# Patient Record
Sex: Female | Born: 1965 | Race: Asian | Hispanic: No | Marital: Married | Smoking: Never smoker
Health system: Southern US, Community
[De-identification: ages and names within clinical notes are randomized; demographics above are authoritative.]

## PROBLEM LIST (undated history)

## (undated) DIAGNOSIS — I10 Essential (primary) hypertension: Secondary | ICD-10-CM

## (undated) DIAGNOSIS — F419 Anxiety disorder, unspecified: Secondary | ICD-10-CM

## (undated) HISTORY — DX: Anxiety disorder, unspecified: F41.9

## (undated) HISTORY — PX: OTHER SURGICAL HISTORY: SHX169

---

## 2013-12-17 ENCOUNTER — Ambulatory Visit (INDEPENDENT_AMBULATORY_CARE_PROVIDER_SITE_OTHER): Payer: BC Managed Care – PPO | Admitting: Emergency Medicine

## 2013-12-17 VITALS — BP 120/80 | HR 71 | Temp 97.7°F | Ht 61.0 in | Wt 139.0 lb

## 2013-12-17 DIAGNOSIS — R42 Dizziness and giddiness: Secondary | ICD-10-CM

## 2013-12-17 LAB — POCT CBC
GRANULOCYTE PERCENT: 51 % (ref 37–80)
HEMATOCRIT: 36.7 % — AB (ref 37.7–47.9)
Hemoglobin: 11.6 g/dL — AB (ref 12.2–16.2)
Lymph, poc: 3 (ref 0.6–3.4)
MCH, POC: 28.7 pg (ref 27–31.2)
MCHC: 31.6 g/dL — AB (ref 31.8–35.4)
MCV: 90.8 fL (ref 80–97)
MID (cbc): 0.5 (ref 0–0.9)
MPV: 7.5 fL (ref 0–99.8)
POC Granulocyte: 3.6 (ref 2–6.9)
POC LYMPH %: 42.6 % (ref 10–50)
POC MID %: 6.4 %M (ref 0–12)
Platelet Count, POC: 451 10*3/uL — AB (ref 142–424)
RBC: 4.04 M/uL (ref 4.04–5.48)
RDW, POC: 13.1 %
WBC: 7.1 10*3/uL (ref 4.6–10.2)

## 2013-12-17 MED ORDER — MECLIZINE HCL 25 MG PO TABS: 25.0000 mg | ORAL_TABLET | Freq: Three times a day (TID) | ORAL | Status: DC | PRN

## 2013-12-17 NOTE — Patient Instructions (Signed)
Vertigo Vertigo means you feel like you or your surroundings are moving when they are not. Vertigo can be dangerous if it occurs when you are at work, driving, or performing difficult activities.  CAUSES  Vertigo occurs when there is a conflict of signals sent to your brain from the visual and sensory systems in your body. There are many different causes of vertigo, including:  Infections, especially in the inner ear.  A bad reaction to a drug or misuse of alcohol and medicines.  Withdrawal from drugs or alcohol.  Rapidly changing positions, such as lying down or rolling over in bed.  A migraine headache.  Decreased blood flow to the brain.  Increased pressure in the brain from a head injury, infection, tumor, or bleeding. SYMPTOMS  You may feel as though the world is spinning around or you are falling to the ground. Because your balance is upset, vertigo can cause nausea and vomiting. You may have involuntary eye movements (nystagmus). DIAGNOSIS  Vertigo is usually diagnosed by physical exam. If the cause of your vertigo is unknown, your caregiver may perform imaging tests, such as an MRI scan (magnetic resonance imaging). TREATMENT  Most cases of vertigo resolve on their own, without treatment. Depending on the cause, your caregiver may prescribe certain medicines. If your vertigo is related to body position issues, your caregiver may recommend movements or procedures to correct the problem. In rare cases, if your vertigo is caused by certain inner ear problems, you may need surgery. HOME CARE INSTRUCTIONS   Follow your caregiver's instructions.  Avoid driving.  Avoid operating heavy machinery.  Avoid performing any tasks that would be dangerous to you or others during a vertigo episode.  Tell your caregiver if you notice that certain medicines seem to be causing your vertigo. Some of the medicines used to treat vertigo episodes can actually make them worse in some people. SEEK  IMMEDIATE MEDICAL CARE IF:   Your medicines do not relieve your vertigo or are making it worse.  You develop problems with talking, walking, weakness, or using your arms, hands, or legs.  You develop severe headaches.  Your nausea or vomiting continues or gets worse.  You develop visual changes.  A family member notices behavioral changes.  Your condition gets worse. MAKE SURE YOU:  Understand these instructions.  Will watch your condition.  Will get help right away if you are not doing well or get worse. Document Released: 04/05/2005 Document Revised: 09/18/2011 Document Reviewed: 01/12/2011 ExitCare Patient Information 2014 [employer].  

## 2013-12-17 NOTE — Progress Notes (Signed)
[facility] [address], [place] [address] [phone] [phone]  Date:  12/17/2013   Name:  Carroll Isabella   DOB:  [birth year]   MRN:  [id number]  PCP:  No PCP Per Patient    Chief Complaint: Dizziness and Rash   History of Present Illness:  Alishia Schroyer is a 48 y.o. very pleasant female patient who presents with the following:  In the [place] for 6 months following immigration from [place].  Over the past month she has experienced short duration episodes of dizziness and a sensation she would pass out.  She has no sensation of a rapid or irregular heart beat, chest pain, tightness or heaviness nor shortness of breath.  No visual symptoms but does have difficulty with gait and balance when afflicted.  She says the episodes come with no warning and are not postural.  She says they last for minutes and may recurr during the day but are not daily.  She has no hypertension, diabetes, HLD.  Non smoker.  No medications.  No history of head injury or antecedent illness.  She has never had these episodes prior to the past month.  She has no nausea or vomiting, no stool change, fever or chills.  No improvement with over the counter medications or other home remedies. Denies other complaint or health concern today.   There are no active problems to display for this patient.   No past medical history on file.  No past surgical history on file.  History  Substance Use Topics  . Smoking status: Never Smoker   . Smokeless tobacco: Never Used  . Alcohol Use: No    No family history on file.  No Known Allergies  Medication list has been reviewed and updated.  No current outpatient prescriptions on file prior to visit.   No current facility-administered medications on file prior to visit.    Review of Systems:  As per HPI, otherwise negative.    Physical Examination: Filed Vitals:   12/17/13 2036  BP: 120/80  Pulse: 71  Temp: 97.7 F (36.5 C)    Filed Vitals:   12/17/13 2036  Height: 5\' 1"  (1.549 m)  Weight: 139 lb (63.05 kg)   Body mass index is 26.28 kg/(m^2). Ideal Body Weight: Weight in (lb) to have BMI = 25: 132  GEN: WDWN, NAD, Non-toxic, A & O x 3 HEENT: Atraumatic, Normocephalic. Neck supple. No masses, No LAD. Ears and Nose: No external deformity. CV: RRR, No M/G/R. No JVD. No thrill. No extra heart sounds.  No carotid bruit PULM: CTA B, no wheezes, crackles, rhonchi. No retractions. No resp. distress. No accessory muscle use. ABD: S, NT, ND, +BS. No rebound. No HSM. EXTR: No c/c/e NEURO Normal gait, romberg, and tandem gait.  PRRERLA EOMI, CN 2-12 intact PSYCH: Normally interactive. Conversant. Not depressed or anxious appearing.  Calm demeanor.    Assessment and Plan: Dizziness Consider TIA ASA daily Refer neuro Try antivert Red flags discussed as well as ER for worsened symptoms  Signed,  Phillips Odor, MD

## 2013-12-18 LAB — COMPREHENSIVE METABOLIC PANEL
ALK PHOS: 49 U/L (ref 39–117)
ALT: 18 U/L (ref 0–35)
AST: 14 U/L (ref 0–37)
Albumin: 4.4 g/dL (ref 3.5–5.2)
BUN: 9 mg/dL (ref 6–23)
CALCIUM: 9.4 mg/dL (ref 8.4–10.5)
CO2: 26 mEq/L (ref 19–32)
Chloride: 104 mEq/L (ref 96–112)
Creat: 0.76 mg/dL (ref 0.50–1.10)
GLUCOSE: 88 mg/dL (ref 70–99)
Potassium: 4.4 mEq/L (ref 3.5–5.3)
Sodium: 139 mEq/L (ref 135–145)
Total Bilirubin: 0.3 mg/dL (ref 0.2–1.2)
Total Protein: 7.6 g/dL (ref 6.0–8.3)

## 2013-12-18 LAB — TSH: TSH: 1.636 u[IU]/mL (ref 0.350–4.500)

## 2013-12-25 ENCOUNTER — Ambulatory Visit (INDEPENDENT_AMBULATORY_CARE_PROVIDER_SITE_OTHER): Payer: BC Managed Care – PPO | Admitting: Family Medicine

## 2013-12-25 VITALS — BP 124/78 | HR 73 | Temp 97.9°F | Resp 18 | Ht 62.0 in | Wt 124.0 lb

## 2013-12-25 DIAGNOSIS — R42 Dizziness and giddiness: Secondary | ICD-10-CM

## 2013-12-25 DIAGNOSIS — J392 Other diseases of pharynx: Secondary | ICD-10-CM

## 2013-12-25 MED ORDER — FLUTICASONE PROPIONATE 50 MCG/ACT NA SUSP: 2.0000 | Freq: Every day | NASAL | Status: DC

## 2013-12-25 NOTE — Progress Notes (Signed)
   Subjective:    Patient ID: Karina Baker, female    DOB: Jan 11, 1966, 48 y.o.   MRN: [id number]  HPI This is a very pleasant 48 yo female who presents today complaining of dry throat and having a funny feeling in left ear for 2 days. She was seen 12/17/2013 by Dr. Dareen PianoAnderson for complaints of dizziness, near syncope and feeling foggy in her brain. She was started on meclizine and low dose aspirin and referred to neurology for evaluation of symptoms and questionable TIA. [facility]. Her symptoms for which she saw Dr. Dareen PianoAnderson last week have not increased in frequency or intensity and may have been improved with meclizine. She admits that her stress level may be contributing to her symptoms. Her main complaints today are a funny feeling in her right ear and dry throat. She notes some improvement to hear ear with pressure relief measures, she hears crinkling with vigorous chewing.  The patient [place] moved [place] She has been married less than 1 year. 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 She was evaluated by her eye doctor yesterday who told her that her reading glasses prescription was too strong and she has since gotten the appropriate strength.   Review of Systems No fever, no chills, no headache, mild sore throat, no nasal drainage, some post nasal drainage, no cough.    Objective:   Physical Exam  Vitals reviewed. Constitutional: She is oriented to person, place, and time. She appears well-developed and well-nourished. No distress.  HENT:  Head: Normocephalic and atraumatic.  Right Ear: Hearing, external ear and ear canal normal. Tympanic membrane is retracted.  Left Ear: Hearing, tympanic membrane, external ear and ear canal normal.  Nose: Nose normal.  Mouth/Throat: Oropharynx is clear and moist. No oropharyngeal exudate.  Eyes: Conjunctivae and  EOM are normal. Pupils are equal, round, and reactive to light. Right eye exhibits no discharge. Left eye exhibits no discharge. No scleral icterus.  Neck: Normal range of motion. Neck supple.  Cardiovascular: Normal rate, regular rhythm and normal heart sounds.   Pulmonary/Chest: Effort normal and breath sounds normal.  Musculoskeletal: Normal range of motion.  Lymphadenopathy:    She has no cervical adenopathy.  Neurological: She is alert and oriented to person, place, and time. She has normal reflexes. No cranial nerve deficit.  Skin: Skin is warm and dry. She is not diaphoretic.  Psychiatric: She has a normal mood and affect. Her behavior is normal. Judgment and thought content normal.       Assessment & Plan:  1. Eustacian tube disorder -she can continue meclizine - fluticasone (FLONASE) 50 MCG/ACT nasal spray; Place 2 sprays into both nostrils daily.  Dispense: 16 g; Refill: 6 - can continue to do gentle pressure relief measure  2. Dry throat -explained that this is a common side effect from meclizine -encouraged increased hydration, sour candies  Appointment with neurology as scheduled tomorrow  Emi Belfasteborah B. Gessner, FNP-BC  [facility]  12/27/2013 8:21 PM

## 2013-12-26 ENCOUNTER — Ambulatory Visit (INDEPENDENT_AMBULATORY_CARE_PROVIDER_SITE_OTHER): Payer: BC Managed Care – PPO | Admitting: Neurology

## 2013-12-26 ENCOUNTER — Encounter: Payer: Self-pay | Admitting: Neurology

## 2013-12-26 VITALS — BP 133/71 | HR 93 | Ht 60.0 in | Wt 140.0 lb

## 2013-12-26 DIAGNOSIS — F419 Anxiety disorder, unspecified: Secondary | ICD-10-CM | POA: Insufficient documentation

## 2013-12-26 DIAGNOSIS — R42 Dizziness and giddiness: Secondary | ICD-10-CM

## 2013-12-26 DIAGNOSIS — F411 Generalized anxiety disorder: Secondary | ICD-10-CM

## 2013-12-26 NOTE — Progress Notes (Signed)
[facility]    Karina Baker:  Dr Hosie PoissonSumner Referring Karina Baker: Karina Baker, [name], MD Primary Care Physician:  No PCP Per Patient  CC:  dizziness  HPI:  Karina GitelmanChristine Belle Baker is a 48 y.o. female here as a referral from Dr. Dareen PianoAnderson for dizziness evaluation  Recent immigration from [place]. Recent onset of brief episodes of dizziness and sensation of passing out. No palpitations or chest pain associated with this events. No visual changes. Does note difficulty with gait and imbalance during these events. Come on suddenly, last a few minutes. Notes initial episode occurred after missing a meal. Not postural. Notes that it can be triggered by stress or anxiety. Recently evaluated by eye doctor, told her prescription was too strong. She spends a lot of time in front of the computer and is stressed at work, needs to reach a quota every day. No focal motor or sensory changes. Notes she does hydrate well, will occasionally skip a meal while at work, thinks events may happen more after she skips a meal. Evaluated in the ED, started on daily ASA 81mg .   Notes that the majority of the events occur while at work, very infrequently away from work. No history of HTN, DM, HLD. No family history of multiple strokes/dementia at a young age.   Review of Systems: Out of a complete 14 system review, the patient complains of only the following symptoms, and all other reviewed systems are negative. + blurred vision, numbness, dizziness, snoring, anxiety  History   Social History  . Marital Status: Married    Spouse Name: N/A    Number of Children: N/A  . Years of Education: N/A   Occupational History  . Not on file.   Social History Main Topics  . Smoking status: Never Smoker   . Smokeless tobacco: Never Used  . Alcohol Use: No  . Drug Use: No  . Sexual Activity: Not on file   Other Topics Concern  . Not on file   Social History Narrative   Patient is married   Patient is  right handed   Patient has a Energy managerBachelor's degree   Patient drinks 1 cup daily    No family history on file.  No past medical history on file.  No past surgical history on file.  Current Outpatient Prescriptions  Medication Sig Dispense Refill  . fluticasone (FLONASE) 50 MCG/ACT nasal spray Place 2 sprays into both nostrils daily.  16 g  6  . meclizine (ANTIVERT) 25 MG tablet Take 1 tablet (25 mg total) by mouth 3 (three) times daily as needed.  60 tablet  0   No current facility-administered medications for this visit.    Allergies as of 12/26/2013  . (No Known Allergies)    Vitals: BP 133/71  Pulse 93  Ht 5' (1.524 m)  Wt 140 lb (63.504 kg)  BMI 27.34 kg/m2 Last Weight:  Wt Readings from Last 1 Encounters:  12/26/13 140 lb (63.504 kg)   Last Height:   Ht Readings from Last 1 Encounters:  12/26/13 5' (1.524 m)     Physical exam: Exam: Gen: NAD, conversant Eyes: anicteric sclerae, moist conjunctivae HENT: Atraumatic, oropharynx clear Neck: Trachea midline; supple,  Lungs: CTA, no wheezing, rales, rhonic                          CV: RRR, no MRG Abdomen: Soft, non-tender;  Extremities: No peripheral edema  Skin: Normal temperature, no rash,  Psych:  Appropriate affect, pleasant  Neuro: MS: AA&Ox3, appropriately interactive, normal affect   Speech: fluent w/o paraphasic error  Memory: good recent and remote recall  CN: PERRL, EOMI no nystagmus, no ptosis, sensation intact to LT V1-V3 bilat, face symmetric, no weakness, hearing grossly intact, palate elevates symmetrically, shoulder shrug 5/5 bilat,  tongue protrudes midline, no fasiculations noted.  Motor: normal bulk and tone Strength: 5/5  In all extremities  Coord: rapid alternating and point-to-point (FNF, HTS) movements intact.  Reflexes: symmetrical, bilat downgoing toes  Sens: LT intact in all extremities  Gait: posture, stance, stride and arm-swing normal. Tandem gait intact. Able to walk on  heels and toes. Romberg absent.   Assessment:  After physical and neurologic examination, review of laboratory studies, imaging, neurophysiology testing and pre-existing records, assessment will be reviewed on the problem list.  Plan:  Treatment plan and additional workup will be reviewed under Problem List.  1)Dizziness 2)Anxiety  47y/o woman presents for initial evaluation of recurrent episodes of transient dizziness. Based on clinical history and exam suspect that these likely are anxiety induced. Would also consider TIA though less likely based on history and lack of risk factors. Therefore will hold off on TIA workup at this time. Will continue ASA but can d/c meclizine. Counseled patient on stress reducing activities. Counseled her to wear her new glasses with the correct prescription. Instructed her to call our office in 4 weeks if no improvement in symptoms, instructed to ED if symptoms worsen or do not resolve.  Karina ChoPeter Sumner, DO  [facility] [address] [place], [place] [id number]  Phone [phone] Fax [phone]

## 2013-12-26 NOTE — Patient Instructions (Signed)
Overall you are doing fairly well but I do want to suggest a few things today:   Remember to drink plenty of fluid, eat healthy meals and do not skip any meals. Try to eat protein with a every meal and eat a healthy snack such as fruit or nuts in between meals. Try to keep a regular sleep-wake schedule and try to exercise daily, particularly in the form of walking, 20-30 minutes a day, if you can.   As far as your medications are concerned, I would like to suggest you stop the meclizine  As far as diagnostic testing:  1)If you symptoms do not improve in the next few weeks we will plan to get a MRI of the brain  I suspect that your symptoms may be related to stress and anxiety. Please try to practice deep breathing and relaxation exercises.   Please call me in 4 weeks to update me on your status  Please call us with any interim questions, concerns, problems, updates or refill requests.   My clinical assistant and will answer any of your questions and relay your messages to me and also relay most of my messages to you.   Our phone number is [phone]. We also have an after hours call service for urgent matters and there is a physician on-call for urgent questions. For any emergencies you know to call 911 or go to the nearest emergency room

## 2015-08-02 ENCOUNTER — Ambulatory Visit (INDEPENDENT_AMBULATORY_CARE_PROVIDER_SITE_OTHER): Payer: BLUE CROSS/BLUE SHIELD | Admitting: Physician Assistant

## 2015-08-02 ENCOUNTER — Ambulatory Visit (HOSPITAL_COMMUNITY): Payer: Self-pay

## 2015-08-02 ENCOUNTER — Ambulatory Visit (INDEPENDENT_AMBULATORY_CARE_PROVIDER_SITE_OTHER): Payer: BLUE CROSS/BLUE SHIELD

## 2015-08-02 VITALS — BP 138/88 | HR 76 | Temp 98.5°F | Resp 16 | Ht 61.5 in | Wt 144.4 lb

## 2015-08-02 DIAGNOSIS — R079 Chest pain, unspecified: Secondary | ICD-10-CM | POA: Diagnosis not present

## 2015-08-02 DIAGNOSIS — Z3202 Encounter for pregnancy test, result negative: Secondary | ICD-10-CM

## 2015-08-02 LAB — COMPLETE METABOLIC PANEL WITH GFR
ALT: 18 U/L (ref 6–29)
AST: 15 U/L (ref 10–35)
Albumin: 4.7 g/dL (ref 3.6–5.1)
Alkaline Phosphatase: 50 U/L (ref 33–115)
BUN: 11 mg/dL (ref 7–25)
CALCIUM: 9.9 mg/dL (ref 8.6–10.2)
CHLORIDE: 103 mmol/L (ref 98–110)
CO2: 24 mmol/L (ref 20–31)
Creat: 0.76 mg/dL (ref 0.50–1.10)
GFR, Est African American: >89 mL/min (ref >=60)
GFR, Est Non African American: >89 mL/min (ref >=60)
Glucose, Bld: 93 mg/dL (ref 65–99)
POTASSIUM: 3.8 mmol/L (ref 3.5–5.3)
SODIUM: 141 mmol/L (ref 135–146)
Total Bilirubin: 0.5 mg/dL (ref 0.2–1.2)
Total Protein: 7.7 g/dL (ref 6.1–8.1)

## 2015-08-02 LAB — POCT CBC
GRANULOCYTE PERCENT: 54.8 % (ref 37–80)
HEMATOCRIT: 43.7 % (ref 37.7–47.9)
HEMOGLOBIN: 15 g/dL (ref 12.2–16.2)
LYMPH, POC: 3 (ref 0.6–3.4)
MCH: 29.6 pg (ref 27–31.2)
MCHC: 34.4 g/dL (ref 31.8–35.4)
MCV: 86 fL (ref 80–97)
MID (cbc): 0.4 (ref 0–0.9)
MPV: 6.5 fL (ref 0–99.8)
POC Granulocyte: 4.2 (ref 2–6.9)
POC LYMPH PERCENT: 39.6 %L (ref 10–50)
POC MID %: 5.6 % (ref 0–12)
Platelet Count, POC: 468 10*3/uL — AB (ref 142–424)
RBC: 5.08 M/uL (ref 4.04–5.48)
RDW, POC: 13.2 %
WBC: 7.6 10*3/uL (ref 4.6–10.2)

## 2015-08-02 LAB — GLUCOSE, POCT (MANUAL RESULT ENTRY): POC Glucose: 77 mg/dl (ref 70–99)

## 2015-08-02 LAB — POCT URINALYSIS DIP (MANUAL ENTRY)
Bilirubin, UA: NEGATIVE
GLUCOSE UA: NEGATIVE
Ketones, POC UA: NEGATIVE
LEUKOCYTES UA: NEGATIVE
NITRITE UA: NEGATIVE
Protein Ur, POC: NEGATIVE
Spec Grav, UA: 1.01
UROBILINOGEN UA: 0.2
pH, UA: 7

## 2015-08-02 LAB — TSH: TSH: 1.352 u[IU]/mL (ref 0.350–4.500)

## 2015-08-02 LAB — TROPONIN I

## 2015-08-02 LAB — D-DIMER, QUANTITATIVE (NOT AT ARMC): D DIMER QUANT: 0.22 ug{FEU}/mL (ref 0.00–0.48)

## 2015-08-02 LAB — POCT URINE PREGNANCY: PREG TEST UR: NEGATIVE

## 2015-08-02 NOTE — Addendum Note (Signed)
Addended by: Clydia Llano on: 08/02/2015 05:26 PM   Modules accepted: Orders

## 2015-08-02 NOTE — Progress Notes (Signed)
08/02/2015 1:47 PM   DOB: 12/04/1965 / MRN: [id number]  SUBJECTIVE:  Karina Baker is a 50 y.o. never smoking female with a history of anxiety presenting for mild "dullness"  in the left side of her chest that started last week. Reports the symptoms may last an hour. She noticed this for the first time while doing household chores and sometimes with sleeping on her left side.  She associates dizziness, nausea, however these symptoms don't always occur together.  Denies orthopnea, posterior leg pain, leg swelling, belly swelling, SOB and DOE.   She complains of mild left shoulder pain and popping with abduction and flexion above 90 degrees.  This is made worse with using the arm and better with rest.       She has No Known Allergies.   She  has a past medical history of Anxiety.    She  reports that she has never smoked. She has never used smokeless tobacco. She reports that she does not drink alcohol or use illicit drugs. She  has no sexual activity history on file. The patient  has no past surgical history on file.  Her family history is not on file.  Review of Systems  Constitutional: Negative for fever.  Respiratory: Negative for cough.   Cardiovascular: Positive for chest pain.  Gastrointestinal: Negative for nausea.  Skin: Negative for rash.  Neurological: Negative for dizziness and headaches.  Psychiatric/Behavioral: Negative for depression.    Problem list and medications reviewed and updated by myself where necessary, and exist elsewhere in the encounter.   OBJECTIVE:  BP 138/88 mmHg  Pulse 76  Temp(Src) 98.5 F (36.9 C) (Oral)  Resp 16  Ht 5' 1.5" (1.562 m)  Wt 144 lb 6.4 oz (65.499 kg)  BMI 26.85 kg/m2  SpO2 98%  Physical Exam  Constitutional: She is oriented to person, place, and time. She appears well-nourished. No distress.  Eyes: EOM are normal. Pupils are equal, round, and reactive to light.  Cardiovascular: Normal rate.   Pulmonary/Chest:  Effort normal.  Abdominal: She exhibits no distension.  Neurological: She is alert and oriented to person, place, and time. No cranial nerve deficit. Gait normal.  Skin: Skin is dry. She is not diaphoretic.  Psychiatric: She has a normal mood and affect.  Vitals reviewed.   Results for orders placed or performed in visit on 08/02/15 (from the past 72 hour(s))  POCT CBC     Status: Abnormal   Collection Time: 08/02/15  1:13 PM  Result Value Ref Range   WBC 7.6 4.6 - 10.2 K/uL   Lymph, poc 3.0 0.6 - 3.4   POC LYMPH PERCENT 39.6 10 - 50 %L   MID (cbc) 0.4 0 - 0.9   POC MID % 5.6 0 - 12 %M   POC Granulocyte 4.2 2 - 6.9   Granulocyte percent 54.8 37 - 80 %G   RBC 5.08 4.04 - 5.48 M/uL   Hemoglobin 15.0 12.2 - 16.2 g/dL   HCT, POC 78.2 95.6 - 47.9 %   MCV 86.0 80 - 97 fL   MCH, POC 29.6 27 - 31.2 pg   MCHC 34.4 31.8 - 35.4 g/dL   RDW, POC 21.3 %   Platelet Count, POC 468 (A) 142 - 424 K/uL   MPV 6.5 0 - 99.8 fL  POCT glucose (manual entry)     Status: None   Collection Time: 08/02/15  1:13 PM  Result Value Ref Range   POC Glucose 77 70 -  99 mg/dl  POCT urinalysis dipstick     Status: Abnormal   Collection Time: 08/02/15  1:18 PM  Result Value Ref Range   Color, UA yellow yellow   Clarity, UA clear clear   Glucose, UA negative negative   Bilirubin, UA negative negative   Ketones, POC UA negative negative   Spec Grav, UA 1.010    Blood, UA trace-lysed (A) negative   pH, UA 7.0    Protein Ur, POC negative negative   Urobilinogen, UA 0.2    Nitrite, UA Negative Negative   Leukocytes, UA Negative Negative  POCT urine pregnancy     Status: None   Collection Time: 08/02/15  1:18 PM  Result Value Ref Range   Preg Test, Ur Negative Negative    ASSESSMENT AND PLAN  Lenia was seen today for chest pain, arm pain, headache and dizziness.  Diagnoses and all orders for this visit:  Chest pain, unspecified chest pain type: Her exam is reassuring.  There are no changes in her  EKG from 12/17/13. Rad read reassuring.  Will run D-dimer and Troponin to exclude a life threatening process.  Will call her back at [phone] and have advised that if either the trop or d-dimer is elevated she go to the ED.  -     EKG 12-Lead -     POCT CBC -     POCT glucose (manual entry) -     DG Chest 2 View; Future -     POCT urinalysis dipstick -     POCT urine pregnancy -     DG Chest 2 View; Future -     D-dimer, quantitative (not at [facility]) -     Troponin I -     COMPLETE METABOLIC PANEL WITH GFR -     TSH    The patient was advised to call or return to clinic if she does not see an improvement in symptoms or to seek the care of the closest emergency department if she worsens with the above plan.   Deliah Boston, MHS, PA-C [facility] 08/02/2015 1:47 PM

## 2015-08-15 ENCOUNTER — Encounter: Payer: Self-pay | Admitting: *Deleted

## 2016-01-27 ENCOUNTER — Other Ambulatory Visit (HOSPITAL_COMMUNITY)
Admission: RE | Admit: 2016-01-27 | Discharge: 2016-01-27 | Disposition: A | Discharge status: Home / Self Care | Payer: BLUE CROSS/BLUE SHIELD | Source: Ambulatory Visit | Attending: Family Medicine | Admitting: Family Medicine

## 2016-01-27 ENCOUNTER — Other Ambulatory Visit: Payer: Self-pay | Admitting: Family Medicine

## 2016-01-27 DIAGNOSIS — Z1151 Encounter for screening for human papillomavirus (HPV): Secondary | ICD-10-CM | POA: Insufficient documentation

## 2016-01-27 DIAGNOSIS — Z01411 Encounter for gynecological examination (general) (routine) with abnormal findings: Secondary | ICD-10-CM | POA: Diagnosis present

## 2016-01-31 LAB — CYTOLOGY - PAP

## 2017-05-14 ENCOUNTER — Other Ambulatory Visit: Payer: Self-pay | Admitting: Family Medicine

## 2017-05-14 DIAGNOSIS — Z1231 Encounter for screening mammogram for malignant neoplasm of breast: Secondary | ICD-10-CM

## 2017-05-21 ENCOUNTER — Ambulatory Visit
Admission: RE | Admit: 2017-05-21 | Discharge: 2017-05-21 | Disposition: A | Discharge status: Home / Self Care | Payer: BLUE CROSS/BLUE SHIELD | Source: Ambulatory Visit | Attending: Family Medicine | Admitting: Family Medicine

## 2017-05-21 DIAGNOSIS — Z1231 Encounter for screening mammogram for malignant neoplasm of breast: Secondary | ICD-10-CM

## 2018-07-18 ENCOUNTER — Other Ambulatory Visit: Payer: Self-pay | Admitting: Family Medicine

## 2018-07-18 DIAGNOSIS — Z1231 Encounter for screening mammogram for malignant neoplasm of breast: Secondary | ICD-10-CM

## 2018-10-02 ENCOUNTER — Other Ambulatory Visit: Payer: Self-pay | Admitting: Family Medicine

## 2018-10-02 ENCOUNTER — Ambulatory Visit: Payer: BLUE CROSS/BLUE SHIELD

## 2018-10-02 ENCOUNTER — Other Ambulatory Visit (HOSPITAL_COMMUNITY)
Admission: RE | Admit: 2018-10-02 | Discharge: 2018-10-02 | Disposition: A | Discharge status: Home / Self Care | Payer: BLUE CROSS/BLUE SHIELD | Source: Ambulatory Visit | Attending: Family Medicine | Admitting: Family Medicine

## 2018-10-02 DIAGNOSIS — Z01411 Encounter for gynecological examination (general) (routine) with abnormal findings: Secondary | ICD-10-CM | POA: Insufficient documentation

## 2018-10-02 DIAGNOSIS — I10 Essential (primary) hypertension: Secondary | ICD-10-CM | POA: Diagnosis not present

## 2018-10-02 DIAGNOSIS — E559 Vitamin D deficiency, unspecified: Secondary | ICD-10-CM | POA: Diagnosis not present

## 2018-10-02 DIAGNOSIS — Z0000 Encounter for general adult medical examination without abnormal findings: Secondary | ICD-10-CM | POA: Diagnosis not present

## 2018-10-02 DIAGNOSIS — Z1211 Encounter for screening for malignant neoplasm of colon: Secondary | ICD-10-CM | POA: Diagnosis not present

## 2018-10-04 LAB — CYTOLOGY - PAP: Diagnosis: NEGATIVE

## 2019-01-03 ENCOUNTER — Ambulatory Visit: Payer: BLUE CROSS/BLUE SHIELD

## 2019-03-10 ENCOUNTER — Ambulatory Visit
Admission: RE | Admit: 2019-03-10 | Discharge: 2019-03-10 | Disposition: A | Discharge status: Home / Self Care | Payer: 59 | Source: Ambulatory Visit | Attending: Family Medicine | Admitting: Family Medicine

## 2019-03-10 ENCOUNTER — Other Ambulatory Visit: Payer: Self-pay | Admitting: Family Medicine

## 2019-03-10 ENCOUNTER — Other Ambulatory Visit: Payer: Self-pay

## 2019-03-10 DIAGNOSIS — Z1231 Encounter for screening mammogram for malignant neoplasm of breast: Secondary | ICD-10-CM

## 2020-08-06 ENCOUNTER — Other Ambulatory Visit: Payer: Self-pay | Admitting: Family Medicine

## 2020-08-06 DIAGNOSIS — Z0000 Encounter for general adult medical examination without abnormal findings: Secondary | ICD-10-CM

## 2020-11-15 ENCOUNTER — Ambulatory Visit
Admission: RE | Admit: 2020-11-15 | Discharge: 2020-11-15 | Disposition: A | Discharge status: Home / Self Care | Payer: BC Managed Care – PPO | Source: Ambulatory Visit | Attending: Family Medicine | Admitting: Family Medicine

## 2020-11-15 ENCOUNTER — Other Ambulatory Visit: Payer: Self-pay

## 2020-11-15 DIAGNOSIS — Z0000 Encounter for general adult medical examination without abnormal findings: Secondary | ICD-10-CM

## 2021-07-06 ENCOUNTER — Other Ambulatory Visit: Payer: Self-pay | Admitting: Family Medicine

## 2021-07-06 DIAGNOSIS — Z1231 Encounter for screening mammogram for malignant neoplasm of breast: Secondary | ICD-10-CM

## 2021-10-28 ENCOUNTER — Emergency Department (HOSPITAL_BASED_OUTPATIENT_CLINIC_OR_DEPARTMENT_OTHER): Payer: BC Managed Care – PPO

## 2021-10-28 ENCOUNTER — Encounter (HOSPITAL_BASED_OUTPATIENT_CLINIC_OR_DEPARTMENT_OTHER): Payer: Self-pay | Admitting: Emergency Medicine

## 2021-10-28 ENCOUNTER — Other Ambulatory Visit: Payer: Self-pay

## 2021-10-28 ENCOUNTER — Emergency Department (HOSPITAL_BASED_OUTPATIENT_CLINIC_OR_DEPARTMENT_OTHER)
Admission: EM | Admit: 2021-10-28 | Discharge: 2021-10-28 | Disposition: A | Discharge status: Home / Self Care | Payer: BC Managed Care – PPO | Attending: Emergency Medicine | Admitting: Emergency Medicine

## 2021-10-28 DIAGNOSIS — I10 Essential (primary) hypertension: Secondary | ICD-10-CM | POA: Diagnosis not present

## 2021-10-28 DIAGNOSIS — R2689 Other abnormalities of gait and mobility: Secondary | ICD-10-CM | POA: Insufficient documentation

## 2021-10-28 DIAGNOSIS — R002 Palpitations: Secondary | ICD-10-CM | POA: Insufficient documentation

## 2021-10-28 HISTORY — DX: Essential (primary) hypertension: I10

## 2021-10-28 LAB — CBC WITH DIFFERENTIAL/PLATELET
Abs Immature Granulocytes: 0.02 10*3/uL (ref 0.00–0.07)
Basophils Absolute: 0 10*3/uL (ref 0.0–0.1)
Basophils Relative: 0 %
Eosinophils Absolute: 0.3 10*3/uL (ref 0.0–0.5)
Eosinophils Relative: 4 %
HCT: 42.5 % (ref 36.0–46.0)
Hemoglobin: 14.1 g/dL (ref 12.0–15.0)
Immature Granulocytes: 0 %
Lymphocytes Relative: 38 %
Lymphs Abs: 2.7 10*3/uL (ref 0.7–4.0)
MCH: 29.4 pg (ref 26.0–34.0)
MCHC: 33.2 g/dL (ref 30.0–36.0)
MCV: 88.5 fL (ref 80.0–100.0)
Monocytes Absolute: 0.3 10*3/uL (ref 0.1–1.0)
Monocytes Relative: 4 %
Neutro Abs: 3.9 10*3/uL (ref 1.7–7.7)
Neutrophils Relative %: 54 %
Platelets: 482 10*3/uL — ABNORMAL HIGH (ref 150–400)
RBC: 4.8 MIL/uL (ref 3.87–5.11)
RDW: 12.6 % (ref 11.5–15.5)
WBC: 7.3 10*3/uL (ref 4.0–10.5)
nRBC: 0 % (ref 0.0–0.2)

## 2021-10-28 LAB — COMPREHENSIVE METABOLIC PANEL
ALT: 22 U/L (ref 0–44)
AST: 19 U/L (ref 15–41)
Albumin: 4.3 g/dL (ref 3.5–5.0)
Alkaline Phosphatase: 59 U/L (ref 38–126)
Anion gap: 9 (ref 5–15)
BUN: 17 mg/dL (ref 6–20)
CO2: 26 mmol/L (ref 22–32)
Calcium: 9.2 mg/dL (ref 8.9–10.3)
Chloride: 104 mmol/L (ref 98–111)
Creatinine, Ser: 0.83 mg/dL (ref 0.44–1.00)
GFR, Estimated: >60 mL/min (ref >60)
Glucose, Bld: 119 mg/dL — ABNORMAL HIGH (ref 70–99)
Potassium: 3.6 mmol/L (ref 3.5–5.1)
Sodium: 139 mmol/L (ref 135–145)
Total Bilirubin: 0.3 mg/dL (ref 0.3–1.2)
Total Protein: 7.8 g/dL (ref 6.5–8.1)

## 2021-10-28 LAB — TROPONIN I (HIGH SENSITIVITY): Troponin I (High Sensitivity): <2 ng/L (ref <18)

## 2021-10-28 LAB — MAGNESIUM: Magnesium: 2.1 mg/dL (ref 1.7–2.4)

## 2021-10-28 MED ORDER — MECLIZINE HCL 25 MG PO TABS: 25.0000 mg | ORAL_TABLET | Freq: Three times a day (TID) | ORAL | 0 refills | Status: DC | PRN

## 2021-10-28 NOTE — ED Provider Notes (Signed)
? ?Emergency Department Provider Note ? ? ?I have reviewed the triage vital signs and the nursing notes. ? ? ?HISTORY ? ?Chief Complaint ?Palpitations ? ? ?HPI ?Karina Baker is a 56 y.o. female with PMH of anxiety and HTN presents to the emergency department for evaluation of intermittently feeling off balance along with heart palpitations.  She notes several weeks of episodic palpitations.  She feels like her heart is "skipping a beat" but sometimes symptoms are more persistent.  Denies any chest pain or pressure.  Denies shortness of breath.  She also feels off balance at times including last night and this morning.  No clear trigger.  She is not experiencing vertigo during these episodes.  She occasionally experiences near syncope events with bowel movements but states this has been going on for a Syleena Mchan time and not particularly worse.  No syncope or falls recently.  No tinnitus.  No drainage from the ear. Occasionally feeling some right ear fullness.  ? ?Past Medical History:  ?Diagnosis Date  ? Anxiety   ? Hypertension   ? ? ?Review of Systems ? ?Constitutional: No fever/chills. Positive episodic off balance sensation.  ?Eyes: No visual changes. ?ENT: No sore throat. Right ear fullness.  ?Cardiovascular: Denies chest pain. Intermittent heart palpitations.  ?Respiratory: Denies shortness of breath. ?Gastrointestinal: No abdominal pain.  No nausea, no vomiting.  No diarrhea.  No constipation. ?Musculoskeletal: Negative for back pain. ?Skin: Negative for rash. ?Neurological: Negative for headaches, focal weakness or numbness. ? ? ?____________________________________________ ? ? ?PHYSICAL EXAM: ? ?VITAL SIGNS: ?ED Triage Vitals  ?Enc Vitals Group  ?   BP 10/28/21 0929 (!) 151/90  ?   Pulse Rate 10/28/21 0929 73  ?   Resp 10/28/21 0929 19  ?   Temp 10/28/21 0929 (!) 97.1 ?F (36.2 ?C)  ?   Temp Source 10/28/21 0929 Oral  ?   SpO2 10/28/21 0929 99 %  ?   Weight 10/28/21 0926 140 lb (63.5 kg)  ?    Height 10/28/21 0926 5\' 2"  (1.575 m)  ? ?Constitutional: Alert and oriented. Well appearing and in no acute distress. ?Eyes: Conjunctivae are normal. PERRL. EOMI. No nystagmus.  ?Head: Atraumatic. ?Ears:  Healthy appearing ear canals and TMs bilaterally. Mild cerumen buildup bilaterally. No impaction. No foreign body.  ?Nose: No congestion/rhinnorhea. ?Mouth/Throat: Mucous membranes are moist.  ?Neck: No stridor. ?Cardiovascular: Normal rate, regular rhythm. Good peripheral circulation. Grossly normal heart sounds.   ?Respiratory: Normal respiratory effort.  No retractions. Lungs CTAB. ?Gastrointestinal: Soft and nontender. No distention.  ?Musculoskeletal: No lower extremity tenderness nor edema. No gross deformities of extremities. ?Neurologic:  Normal speech and language. No gross focal neurologic deficits are appreciated. No facial asymmetry.  ?Skin:  Skin is warm, dry and intact. No rash noted. ? ? ?____________________________________________ ?  ?LABS ?(all labs ordered are listed, but only abnormal results are displayed) ? ?Labs Reviewed  ?COMPREHENSIVE METABOLIC PANEL - Abnormal; Notable for the following components:  ?    Result Value  ? Glucose, Bld 119 (*)   ? All other components within normal limits  ?CBC WITH DIFFERENTIAL/PLATELET - Abnormal; Notable for the following components:  ? Platelets 482 (*)   ? All other components within normal limits  ?MAGNESIUM  ?TROPONIN I (HIGH SENSITIVITY)  ?TROPONIN I (HIGH SENSITIVITY)  ? ?____________________________________________ ? ?EKG ? ? EKG Interpretation ? ?Date/Time:  Friday October 28 2021 09:28:55 EDT ?Ventricular Rate:  69 ?PR Interval:  154 ?QRS Duration: 121 ?QT Interval:  397 ?QTC Calculation: 426 ?R Axis:   57 ?Text Interpretation: Sinus rhythm IVCD, consider atypical RBBB Confirmed by Alona Bene [phone]) on 10/28/2021 9:34:33 AM ?  ? ?  ? ? ?____________________________________________ ? ?RADIOLOGY ? ?DG Chest 2 View ? ?Result Date: 10/28/2021 ?CLINICAL  DATA:  palpitations - CP EXAM: CHEST - 2 VIEW COMPARISON:  08/02/2015 chest x-ray. FINDINGS: No consolidation. No visible pleural effusions or pneumothorax. Biapical pleuroparenchymal scarring. No displaced fracture. IMPRESSION: No evidence of acute cardiopulmonary disease. Electronically Signed   By: Feliberto Harts M.D.   On: 10/28/2021 10:07  ? ?CT HEAD WO CONTRAST ( ) ? ?Result Date: 10/28/2021 ?CLINICAL DATA:  Neuro deficit, acute, stroke suspected EXAM: CT HEAD WITHOUT CONTRAST TECHNIQUE: Contiguous axial images were obtained from the base of the skull through the vertex without intravenous contrast. RADIATION DOSE REDUCTION: This exam was performed according to the departmental dose-optimization program which includes automated exposure control, adjustment of the mA and/or kV according to patient size and/or use of iterative reconstruction technique. COMPARISON:  None. FINDINGS: Brain: No evidence of acute large vascular territory infarction, hemorrhage, hydrocephalus, extra-axial collection or mass lesion/mass effect. Vascular: No hyperdense vessel identified. Skull: No acute fracture. Sinuses/Orbits: Clear sinuses.  No acute orbital findings. Other: No mastoid effusions. IMPRESSION: No evidence of acute intracranial abnormality. Electronically Signed   By: Feliberto Harts M.D.   On: 10/28/2021 10:04   ? ?____________________________________________ ? ? ?PROCEDURES ? ?Procedure(s) performed:  ? ?Procedures ? ?None  ?____________________________________________ ? ? ?INITIAL IMPRESSION / ASSESSMENT AND PLAN / ED COURSE ? ?Pertinent labs & imaging results that were available during my care of the patient were reviewed by me and considered in my medical decision making (see chart for details). ?  ?This patient is Presenting for Evaluation of palpitations and balance issues, which does require a range of treatment options, and is a complaint that involves a high risk of morbidity and mortality. ? ?The  Differential Diagnoses arrhythmia, electrolyte disturbance, dehydration, CNS mass, posterior circulation CVA, vasovagal near-syncope.  ? ?  ?Clinical Laboratory Tests Ordered, included CBC without leukocytosis. No anemia. No AKI. Normal troponin.  ? ?Radiologic Tests Ordered, included CT head and CXR. I independently interpreted the images and agree with radiology interpretation.  ? ?Cardiac Monitor Tracing which shows NSR without ectopy.  ? ? ?Social Determinants of Health Risk patient is a non-smoker.  ? ?Medical Decision Making: Summary:  ?Patient presents to the emergency department with intermittent heart palpitations, lightheadedness, feeling off balance.  No focal neurodeficits to strongly suspect central nervous system process.  She has had persistent symptoms and do plan for CT imaging here to rule out obvious large mass or old appearing stroke.  No concern for acute stroke.  Chest x-ray obtained given her near syncope and palpitation type symptoms.  Labs are pending.  EKG interpreted by me as above.  ? ?Reevaluation with update and discussion with patient. Discussed reassuring w/u here. Placed follow up referral for cardiology. Discussed PCP follow up and meclizine PRN with balance issues. No exam findings or other features to prompt MRI.  ? ?Disposition: discharge ? ?____________________________________________ ? ?FINAL CLINICAL IMPRESSION(S) / ED DIAGNOSES ? ?Final diagnoses:  ?Palpitations  ?Balance problem  ? ? ?NEW OUTPATIENT MEDICATIONS STARTED DURING THIS VISIT: ? ?Discharge Medication List as of 10/28/2021 10:34 AM  ?  ? ?START taking these medications  ? Details  ?meclizine (ANTIVERT) 25 MG tablet Take 1 tablet (25 mg total) by mouth 3 (three) times daily as needed for dizziness.,  Starting Fri 10/28/2021, Normal  ?  ?  ? ? ?Note:  This document was prepared using Dragon voice recognition software and may include unintentional dictation errors. ? ?Alona BeneJoshua Dorinne Graeff, MD, FACEP ?Emergency Medicine ? ?   ?Maia PlanLong, Johneric Mcfadden G, MD ?10/28/21 1529 ? ?

## 2021-10-28 NOTE — Discharge Instructions (Signed)
You are seen in the emergency room today with heart palpitations and feeling off balance.  Your CT scan and lab work here are all reassuring.  I have placed a referral in our system for you to see the cardiologist to further evaluate your feeling of skipping heartbeats.  Please follow closely with your primary care doctor.  I have called in some medication to take only if you experience episodes of dizziness.  Return with any new or suddenly worsening symptoms. ?

## 2021-10-28 NOTE — ED Triage Notes (Signed)
Works nights has felt off balanced and heart skipping beats  on and off x 1 year , has hx of anxiety attacks no n/v/sob ?

## 2021-10-31 ENCOUNTER — Ambulatory Visit (INDEPENDENT_AMBULATORY_CARE_PROVIDER_SITE_OTHER): Payer: BC Managed Care – PPO

## 2021-10-31 ENCOUNTER — Ambulatory Visit: Payer: BC Managed Care – PPO | Admitting: Cardiovascular Disease

## 2021-10-31 ENCOUNTER — Encounter: Payer: Self-pay | Admitting: Cardiovascular Disease

## 2021-10-31 VITALS — BP 124/70 | HR 75 | Ht 62.0 in | Wt 140.6 lb

## 2021-10-31 DIAGNOSIS — R002 Palpitations: Secondary | ICD-10-CM | POA: Diagnosis not present

## 2021-10-31 NOTE — Patient Instructions (Signed)
Medication Instructions:  ?No changes ?*If you need a refill on your cardiac medications before your next appointment, please call your pharmacy* ? ? ?Lab Work: ?none ? ? ?Testing/Procedures: ?Your physician has requested that you have an echocardiogram. Echocardiography is a painless test that uses sound waves to create images of your heart. It provides your doctor with information about the size and shape of your heart and how well your heart?s chambers and valves are working. This procedure takes approximately one hour. There are no restrictions for this procedure. ? ?3 Day Zio Heart Monitor - see instructions below ? ? ?Follow-Up: ?As needed ? ? ?Other Instructions ?ZIO XT- Long Term Monitor Instructions ? ?Your physician has requested you wear a ZIO patch monitor for 3 days.  ?This is a single patch monitor. [employer] supplies one patch monitor per enrollment. Additional ?stickers are not available. Please do not apply patch if you will be having a Nuclear Stress Test,  ?Echocardiogram, Cardiac CT, MRI, or Chest Xray during the period you would be wearing the  ?monitor. The patch cannot be worn during these tests. You cannot remove and re-apply the  ?ZIO XT patch monitor.  ?Your ZIO patch monitor will be mailed 3 day USPS to your address on file. It may take 3-5 days  ?to receive your monitor after you have been enrolled.  ?Once you have received your monitor, please review the enclosed instructions. Your monitor  ?has already been registered assigning a specific monitor serial # to you. ? ?Billing and Patient Assistance Program Information ? ?We have supplied [employer] with any of your insurance information on file for billing purposes. ?[employer] offers a sliding scale Patient Assistance Program for patients that do not have  ?insurance, or whose insurance does not completely cover the cost of the ZIO monitor.  ?You must apply for the Patient Assistance Program to qualify for this discounted rate.  ?To apply,  please call [employer] at [phone], select option 4, select option 2, ask to apply for  ?Patient Assistance Program. Meredeth Ide will ask your household income, and how many people  ?are in your household. They will quote your out-of-pocket cost based on that information.  ?[employer] will also be able to set up a 85-month, interest-free payment plan if needed. ? ?Applying the monitor ?  ?Shave hair from upper left chest.  ?Hold abrader disc by orange tab. Rub abrader in 40 strokes over the upper left chest as  ?indicated in your monitor instructions.  ?Clean area with 4 enclosed alcohol pads. Let dry.  ?Apply patch as indicated in monitor instructions. Patch will be placed under collarbone on left  ?side of chest with arrow pointing upward.  ?Rub patch adhesive wings for 2 minutes. Remove white label marked "1". Remove the white  ?label marked "2". Rub patch adhesive wings for 2 additional minutes.  ?While looking in a mirror, press and release button in center of patch. A small green light will  ?flash 3-4 times. This will be your only indicator that the monitor has been turned on.  ?Do not shower for the first 24 hours. You may shower after the first 24 hours.  ?Press the button if you feel a symptom. You will hear a small click. Record Date, Time and  ?Symptom in the Patient Logbook.  ?When you are ready to remove the patch, follow instructions on the last 2 pages of Patient  ?Logbook. Stick patch monitor onto the last page of Patient Logbook.  ?Place Patient Logbook in  the blue and white box. Use locking tab on box and tape box closed  ?securely. The blue and white box has prepaid postage on it. Please place it in the mailbox as  ?soon as possible. Your physician should have your test results approximately 7 days after the  ?monitor has been mailed back to [facility].  ?Call [facility] at [phone] if you have questions regarding  ?your ZIO XT patch monitor. Call them immediately if you see  an orange light blinking on your  ?monitor.  ?If your monitor falls off in less than 4 days, contact our Monitor department at [phone].  ?If your monitor becomes loose or falls off after 4 days call [employer] at [phone] for  ?suggestions on securing your monitor ? ?

## 2021-10-31 NOTE — Progress Notes (Unsigned)
[id number] zio xt from office inventory applied in office. ?

## 2021-10-31 NOTE — Progress Notes (Signed)
? ? ?Chief Complaint  ?Patient presents with  ? New Patient (Initial Visit)  ?  Palpitations  ? ?History of Present Illness: 56 yo female with history of anxiety and HTN here today as a new patient for the evaluation of palpitations. She was seen in the ED 10/28/21 with c/o feeling her heart skip and race. EKG in the ED with sinus with incomplete RBBB. Labs were ok. She has no known cardiac disease. No FH of premature CAD. She tells me that she felt her heart skipping beats while at work. She is working night shift for the first time. No recurrence over the past two days. No chest pain or dyspnea.  ? ?Primary Care Physician: Jonathon Jordan, MD ? ?Past Medical History:  ?Diagnosis Date  ? Anxiety   ? Hypertension   ? ?Past Surgical History:  ?Procedure Laterality Date  ? None    ? ?None ? ?Current Outpatient Medications  ?Medication Sig Dispense Refill  ? aspirin 81 MG EC tablet Take 1 tablet by mouth daily.    ? Krill Oil 500 MG CAPS Take 1 capsule by mouth daily.    ? meclizine (ANTIVERT) 25 MG tablet Take 1 tablet (25 mg total) by mouth 3 (three) times daily as needed for dizziness. 30 tablet 0  ? olmesartan (BENICAR) 5 MG tablet Take 5 mg by mouth daily.    ? vitamin C (ASCORBIC ACID) 500 MG tablet Take 1 tablet by mouth daily.    ? ?No current facility-administered medications for this visit.  ? ? ?No Known Allergies ? ?Social History  ? ?Socioeconomic History  ? Marital status: Married  ?  Spouse name: Not on file  ? Number of children: 0  ? Years of education: Not on file  ? Highest education level: Not on file  ?Occupational History  ? Occupation: She works as in an office  ?Tobacco Use  ? Smoking status: Never  ? Smokeless tobacco: Never  ?Vaping Use  ? Vaping Use: Never used  ?Substance and Sexual Activity  ? Alcohol use: No  ? Drug use: No  ? Sexual activity: Not on file  ?Other Topics Concern  ? Not on file  ?Social History Narrative  ? Patient is married  ? Patient is right handed  ? Patient has a  Bachelor's degree  ? Patient drinks 1 cup daily  ? ?Social Determinants of Health  ? ?Financial Resource Strain: Not on file  ?Food Insecurity: Not on file  ?Transportation Needs: Not on file  ?Physical Activity: Not on file  ?Stress: Not on file  ?Social Connections: Not on file  ?Intimate Partner Violence: Not on file  ? ? ?Family History  ?Problem Relation Age of Onset  ? CVA Mother   ? Breast cancer Neg Hx   ? ? ?Review of Systems:  As stated in the HPI and otherwise negative.  ? ?BP 124/70   Pulse 75   Ht 5\' 2"  (1.575 m)   Wt 140 lb 9.6 oz (63.8 kg)   SpO2 99%   BMI 25.72 kg/m?  ? ?Physical Examination: ?General: Well developed, well nourished, NAD  ?HEENT: OP clear, mucus membranes moist  ?SKIN: warm, dry. No rashes. ?Neuro: No focal deficits  ?Musculoskeletal: Muscle strength 5/5 all ext  ?Psychiatric: Mood and affect normal  ?Neck: No JVD, no carotid bruits, no thyromegaly, no lymphadenopathy.  ?Lungs:Clear bilaterally, no wheezes, rhonci, crackles ?Cardiovascular: Regular rate and rhythm. No murmurs, gallops or rubs. ?Abdomen:Soft. Bowel sounds present. Non-tender.  ?  Extremities: No lower extremity edema. Pulses are 2 + in the bilateral DP/PT. ? ?EKG:  EKG is not ordered today. ?The ekg ordered today demonstrates  ?EKG from 10/28/21 reviewed and shows sinus with incomplete RBBB ? ?Recent Labs: ?10/28/2021: ALT 22; BUN 17; Creatinine, Ser 0.83; Hemoglobin 14.1; Magnesium 2.1; Platelets 482; Potassium 3.6; Sodium 139  ? ?Lipid Panel ?No results found for: CHOL, TRIG, HDL, CHOLHDL, VLDL, LDLCALC, LDLDIRECT ?  ?Wt Readings from Last 3 Encounters:  ?10/31/21 140 lb 9.6 oz (63.8 kg)  ?10/28/21 140 lb (63.5 kg)  ?08/02/15 144 lb 6.4 oz (65.5 kg)  ?  ? ?Assessment and Plan:  ? ?1. Palpitations: Likely early beats. Will arrange a 3 day Zio cardiac monitor. Will arrange an echo to assess LVEF and exclude structural heart disease.  ? ?Labs/ tests ordered today include:  ? ?Orders Placed This Encounter  ?Procedures   ? LONG TERM MONITOR (3-14 DAYS)  ? ECHOCARDIOGRAM COMPLETE  ? ?Disposition:   F/U with me as needed.  ? ? ?Signed, ?Lauree Chandler, MD ?10/31/2021 3:34 PM    ?[facility] ?[address], [place], [place]  [address] ?Phone: [phone]; Fax: [phone]  ? ? ?

## 2021-11-18 ENCOUNTER — Other Ambulatory Visit (HOSPITAL_COMMUNITY)
Admission: RE | Admit: 2021-11-18 | Discharge: 2021-11-18 | Disposition: A | Discharge status: Home / Self Care | Payer: BC Managed Care – PPO | Source: Ambulatory Visit | Attending: Family Medicine | Admitting: Family Medicine

## 2021-11-18 ENCOUNTER — Ambulatory Visit (HOSPITAL_COMMUNITY): Payer: BC Managed Care – PPO | Attending: Cardiology

## 2021-11-18 ENCOUNTER — Ambulatory Visit
Admission: RE | Admit: 2021-11-18 | Discharge: 2021-11-18 | Disposition: A | Discharge status: Home / Self Care | Payer: BC Managed Care – PPO | Source: Ambulatory Visit | Attending: Family Medicine | Admitting: Family Medicine

## 2021-11-18 ENCOUNTER — Telehealth: Payer: Self-pay | Admitting: Cardiovascular Disease

## 2021-11-18 ENCOUNTER — Other Ambulatory Visit: Payer: Self-pay | Admitting: Family Medicine

## 2021-11-18 DIAGNOSIS — Z01411 Encounter for gynecological examination (general) (routine) with abnormal findings: Secondary | ICD-10-CM | POA: Insufficient documentation

## 2021-11-18 DIAGNOSIS — R002 Palpitations: Secondary | ICD-10-CM

## 2021-11-18 DIAGNOSIS — Z1231 Encounter for screening mammogram for malignant neoplasm of breast: Secondary | ICD-10-CM

## 2021-11-18 LAB — ECHOCARDIOGRAM COMPLETE
Area-P 1/2: 3.39 cm2
S' Lateral: 2.5 cm

## 2021-11-18 NOTE — Telephone Encounter (Signed)
Called pt reviewed results of heart monitor and echo.  Please see result notes.  ?

## 2021-11-18 NOTE — Telephone Encounter (Signed)
Follow Up: ? ? ? ?Patient is returning call, concerning her Echo results. ?

## 2021-11-23 LAB — CYTOLOGY - PAP
Comment: NEGATIVE
Diagnosis: NEGATIVE
High risk HPV: NEGATIVE

## 2022-11-06 ENCOUNTER — Other Ambulatory Visit: Payer: Self-pay | Admitting: Family Medicine

## 2022-11-06 DIAGNOSIS — Z1231 Encounter for screening mammogram for malignant neoplasm of breast: Secondary | ICD-10-CM

## 2022-12-01 ENCOUNTER — Ambulatory Visit
Admission: RE | Admit: 2022-12-01 | Discharge: 2022-12-01 | Disposition: A | Discharge status: Home / Self Care | Payer: BC Managed Care – PPO | Source: Ambulatory Visit | Attending: Family Medicine | Admitting: Family Medicine

## 2022-12-01 DIAGNOSIS — Z1231 Encounter for screening mammogram for malignant neoplasm of breast: Secondary | ICD-10-CM

## 2022-12-08 ENCOUNTER — Ambulatory Visit: Payer: BC Managed Care – PPO

## 2023-01-18 ENCOUNTER — Other Ambulatory Visit: Payer: Self-pay | Admitting: Nurse Practitioner

## 2023-01-18 DIAGNOSIS — D75839 Thrombocytosis, unspecified: Secondary | ICD-10-CM

## 2023-01-18 NOTE — Progress Notes (Signed)
New Hematology/Oncology Consult   Requesting MD: Dr. Mila Palmer  [phone]      Reason for Consult: Thrombocytosis  HPI: Karina Baker is a 57 year old woman referred for evaluation of thrombocytosis.  She was seen by Dr. Paulino Rily on 12/01/2022 for a wellness exam.  Labs showed hemoglobin 12.7, white count 8.6, platelet count 473,000, chemistry panel unremarkable, ferritin 249.  Comparison CBC 11/18/2021 platelet count 508,000; 11/15/2020 platelet count 539,000; 10/31/2019 platelet count 526,000; 10/02/2018 platelet count 477,000; 02/02/2017 platelet count 514,000.  Ms. Gengler reports a normal platelet count when she had labs done in [place] 300,000 range.  She feels well overall.  She has never had a blood clot, stroke, phlebitis.  No bleeding.  She has facial flushing with stress.  She has an occasional rash on her forearms.  No swollen or painful joints.  No drenching night sweats.  She has a good appetite.  No unintentional weight loss.  Periodic left shoulder pain.  No shortness of breath.  No cough.  No change in bowel habits.  No urinary symptoms.     Past Medical History:  Diagnosis Date   Anxiety    Hypertension      Past Surgical History:  Procedure Laterality Date   None       Current Outpatient Medications:    Apple Cider Vinegar-Ginger (YUMVS APPLE CID VINEGAR-GINGER) 500-5 MG CHEW, Chew by mouth., Disp: , Rfl:    aspirin 81 MG EC tablet, Take 1 tablet by mouth daily., Disp: , Rfl:    Cholecalciferol (VITAMIN D3) 50 MCG (2000 UT) capsule, Take 2,000 Units by mouth daily., Disp: , Rfl:    Misc Natural Products (YUMVS BEET ROOT-TART CHERRY) 250-0.5 MG CHEW, Chew by mouth., Disp: , Rfl:    Multiple Vitamin (MULTIVITAMIN) tablet, Take 1 tablet by mouth daily., Disp: , Rfl:    olmesartan (BENICAR) 5 MG tablet, Take 5 mg by mouth daily., Disp: , Rfl: :    No Known Allergies:  FH: Mother deceased with a stroke.  Father had a cardiac arrest possibly following  COVID.  Brother deceased with pancreatitis.  No family history of a blood disorder or autoimmune disorder.  SOCIAL HISTORY: She lives in [place].  She is married.  She works as a Management consultant.  No tobacco or alcohol use.  Review of Systems: She feels well overall.  She has never had a blood clot, stroke, phlebitis.  No bleeding.  She has facial flushing with stress.  She has an occasional rash on her forearms.  No swollen or painful joints.  No drenching night sweats.  She has a good appetite.  No unintentional weight loss.  Periodic left shoulder pain.  No shortness of breath.  No cough.  No change in bowel habits.  No urinary symptoms.  Physical Exam:  Blood pressure 131/85, pulse 79, temperature 98.1 F (36.7 C), temperature source Temporal, resp. rate 18, height 5\' 1"  (1.549 m), weight 137 lb 9.6 oz (62.4 kg), SpO2 98%.  HEENT: No thrush or ulcers. Lungs: Lungs clear bilaterally. Cardiac: Regular rate and rhythm. Abdomen: No hepatosplenomegaly. Vascular: No leg edema. Lymph nodes: No palpable cervical, supraclavicular, axillary or inguinal lymph nodes. Neurologic: Alert and oriented. Musculoskeletal: No erythematous/swollen joints.  LABS:   Recent Labs    01/19/23 1058  WBC 8.9  HGB 13.5  HCT 40.9  PLT 439*  Peripheral blood smear-platelets appear normal in number, no giant platelets; Red cell morphology unremarkable; white cell morphology unremarkable.  No results for input(s): "  NA", "K", "CL", "CO2", "GLUCOSE", "BUN", "CREATININE", "CALCIUM" in the last 72 hours.    RADIOLOGY:  No results found.  Assessment and Plan:   Thrombocytosis Hypertension  Ms. Manrique was referred for evaluation of thrombocytosis.  The platelet count has been mildly elevated for many years.  The thrombocytosis may be a benign normal variant.  We discussed other potential etiologies.  We are checking a myeloproliferative panel and ANA.  She will return for a CBC and follow-up  visit in 1 year.  Patient seen with Dr. Truett Perna.    Lonna Cobb, NP 01/19/2023, 12:44 PM  This was a shared visit with Lonna Cobb.  Ms. Berrey was interviewed and examined.  I reviewed the peripheral blood smear.  She is referred for evaluation of thrombocytosis.  She has chronic mild thrombocytosis.  The thrombocytosis is likely secondary to benign normal variant.  She does not have evidence of iron deficiency or a systemic inflammatory condition.  We will check a myeloproliferative disorder molecular panel today.  I was present for greater than 50% of today's visit.  I performed medical decision making.  Mancel Bale, MD

## 2023-01-19 ENCOUNTER — Other Ambulatory Visit: Payer: Self-pay | Admitting: *Deleted

## 2023-01-19 ENCOUNTER — Inpatient Hospital Stay: Payer: BC Managed Care – PPO

## 2023-01-19 ENCOUNTER — Inpatient Hospital Stay: Payer: BC Managed Care – PPO | Attending: Nurse Practitioner | Admitting: Nurse Practitioner

## 2023-01-19 ENCOUNTER — Encounter: Payer: Self-pay | Admitting: Nurse Practitioner

## 2023-01-19 VITALS — BP 131/85 | HR 79 | Temp 98.1°F | Resp 18 | Ht 61.0 in | Wt 137.6 lb

## 2023-01-19 DIAGNOSIS — Z7982 Long term (current) use of aspirin: Secondary | ICD-10-CM | POA: Insufficient documentation

## 2023-01-19 DIAGNOSIS — I10 Essential (primary) hypertension: Secondary | ICD-10-CM | POA: Insufficient documentation

## 2023-01-19 DIAGNOSIS — D75839 Thrombocytosis, unspecified: Secondary | ICD-10-CM | POA: Insufficient documentation

## 2023-01-19 LAB — CBC WITH DIFFERENTIAL (CANCER CENTER ONLY)
Abs Immature Granulocytes: 0.02 10*3/uL (ref 0.00–0.07)
Basophils Absolute: 0 10*3/uL (ref 0.0–0.1)
Basophils Relative: 0 %
Eosinophils Absolute: 0.3 10*3/uL (ref 0.0–0.5)
Eosinophils Relative: 3 %
HCT: 40.9 % (ref 36.0–46.0)
Hemoglobin: 13.5 g/dL (ref 12.0–15.0)
Immature Granulocytes: 0 %
Lymphocytes Relative: 41 %
Lymphs Abs: 3.6 10*3/uL (ref 0.7–4.0)
MCH: 29.7 pg (ref 26.0–34.0)
MCHC: 33 g/dL (ref 30.0–36.0)
MCV: 90.1 fL (ref 80.0–100.0)
Monocytes Absolute: 0.4 10*3/uL (ref 0.1–1.0)
Monocytes Relative: 5 %
Neutro Abs: 4.6 10*3/uL (ref 1.7–7.7)
Neutrophils Relative %: 51 %
Platelet Count: 439 10*3/uL — ABNORMAL HIGH (ref 150–400)
RBC: 4.54 MIL/uL (ref 3.87–5.11)
RDW: 12.3 % (ref 11.5–15.5)
WBC Count: 8.9 10*3/uL (ref 4.0–10.5)
nRBC: 0 % (ref 0.0–0.2)

## 2023-01-19 LAB — SAVE SMEAR(SSMR), FOR PROVIDER SLIDE REVIEW

## 2023-01-19 LAB — FERRITIN: Ferritin: 233 ng/mL (ref 11–307)

## 2023-01-23 LAB — ANTINUCLEAR ANTIBODIES, IFA: ANA Ab, IFA: NEGATIVE

## 2023-01-26 ENCOUNTER — Telehealth: Payer: Self-pay

## 2023-01-26 NOTE — Telephone Encounter (Signed)
I informed the individual via voicemail that her ANA test results were negative. Additionally, I documented in her chart that if she has any concerns or questions, she is welcome to contact our office.

## 2023-03-31 LAB — JAK2 (INCLUDING V617F AND EXON 12), MPL,& CALR W/RFL MPN PANEL (NGS)

## 2023-04-23 ENCOUNTER — Encounter (HOSPITAL_BASED_OUTPATIENT_CLINIC_OR_DEPARTMENT_OTHER): Payer: Self-pay

## 2023-04-23 ENCOUNTER — Emergency Department (HOSPITAL_BASED_OUTPATIENT_CLINIC_OR_DEPARTMENT_OTHER)
Admission: EM | Admit: 2023-04-23 | Discharge: 2023-04-23 | Disposition: A | Discharge status: Home / Self Care | Payer: BC Managed Care – PPO | Attending: Emergency Medicine | Admitting: Emergency Medicine

## 2023-04-23 DIAGNOSIS — R42 Dizziness and giddiness: Secondary | ICD-10-CM | POA: Diagnosis present

## 2023-04-23 LAB — CBC
HCT: 35.1 % — ABNORMAL LOW (ref 36.0–46.0)
Hemoglobin: 11.7 g/dL — ABNORMAL LOW (ref 12.0–15.0)
MCH: 29.7 pg (ref 26.0–34.0)
MCHC: 33.3 g/dL (ref 30.0–36.0)
MCV: 89.1 fL (ref 80.0–100.0)
Platelets: 391 10*3/uL (ref 150–400)
RBC: 3.94 MIL/uL (ref 3.87–5.11)
RDW: 12.5 % (ref 11.5–15.5)
WBC: 7.9 10*3/uL (ref 4.0–10.5)
nRBC: 0 % (ref 0.0–0.2)

## 2023-04-23 LAB — BASIC METABOLIC PANEL
Anion gap: 7 (ref 5–15)
BUN: 11 mg/dL (ref 6–20)
CO2: 27 mmol/L (ref 22–32)
Calcium: 8.9 mg/dL (ref 8.9–10.3)
Chloride: 107 mmol/L (ref 98–111)
Creatinine, Ser: 0.69 mg/dL (ref 0.44–1.00)
GFR, Estimated: >60 mL/min (ref >60)
Glucose, Bld: 95 mg/dL (ref 70–99)
Potassium: 3.7 mmol/L (ref 3.5–5.1)
Sodium: 141 mmol/L (ref 135–145)

## 2023-04-23 MED ORDER — MECLIZINE HCL 25 MG PO TABS: 25.0000 mg | ORAL_TABLET | Freq: Three times a day (TID) | ORAL | 0 refills | Status: AC | PRN

## 2023-04-23 NOTE — ED Provider Notes (Signed)
[place] EMERGENCY DEPARTMENT AT [facility] Provider Note   CSN: [id number] Arrival date & time: 04/23/23  1741     History  Chief Complaint  Patient presents with   Dizziness    Karina Baker is a 57 y.o. female.   Dizziness Patient presents with dizziness.  Over the last couple weeks has had episodes where she will feel the room moving.  States she will lay on her left side and feel things rolling back.  Sometimes will happen with standing.  No numbness weakness.  No real lightheadedness.  No headache.  No confusion.  No chest pain or trouble breathing.  Sent from urgent care for possible EKG changes.    Past Medical History:  Diagnosis Date   Anxiety    Hypertension     Home Medications Prior to Admission medications   Medication Sig Start Date End Date Taking? Authorizing Provider  meclizine (ANTIVERT) 25 MG tablet Take 1 tablet (25 mg total) by mouth 3 (three) times daily as needed for dizziness. 04/23/23  Yes Benjiman Core, MD  Apple Cider Vinegar-Ginger [facility] APPLE CID VINEGAR-GINGER) 500-5 MG CHEW Chew by mouth.    [provider]  aspirin 81 MG EC tablet Take 1 tablet by mouth daily.    [provider]  Cholecalciferol (VITAMIN D3) 50 MCG (2000 UT) capsule Take 2,000 Units by mouth daily.    [provider]  Misc Natural Products (YUMVS BEET ROOT-TART CHERRY) 250-0.5 MG CHEW Chew by mouth.    [provider]  Multiple Vitamin (MULTIVITAMIN) tablet Take 1 tablet by mouth daily.    [provider]  olmesartan (BENICAR) 5 MG tablet Take 5 mg by mouth daily. 09/13/21   [provider]      Allergies    Patient has no known allergies.    Review of Systems   Review of Systems  Neurological:  Positive for dizziness.    Physical Exam Updated Vital Signs BP 138/89 (BP Location: Right Arm)   Pulse 63   Temp 98.7 F (37.1 C) (Oral)   Resp 16   Ht 5\' 1"  (1.549 m)   Wt 63 kg    SpO2 100%   BMI 26.26 kg/m  Physical Exam Vitals and nursing note reviewed.  HENT:     Ears:     Comments: Small amount of cerumen bilaterally some clouding of TM on right. Eyes:     Comments: Some mild nystagmus with end gaze.  Movements otherwise intact.  Cardiovascular:     Rate and Rhythm: Normal rate and regular rhythm.  Skin:    General: Skin is warm.  Neurological:     Mental Status: She is alert and oriented to person, place, and time. Mental status is at baseline.     Comments: Finger-nose intact bilaterally.  Awake and appropriate.     ED Results / Procedures / Treatments   Labs (all labs ordered are listed, but only abnormal results are displayed) Labs Reviewed  CBC - Abnormal; Notable for the following components:      Result Value   Hemoglobin 11.7 (*)    HCT 35.1 (*)    All other components within normal limits  BASIC METABOLIC PANEL    EKG EKG Interpretation Date/Time:  Monday April 23 2023 18:16:08 EDT Ventricular Rate:  63 PR Interval:  196 QRS Duration:  100 QT Interval:  408 QTC Calculation: 417 R Axis:   67  Text Interpretation: Normal sinus rhythm Incomplete right bundle  branch block Septal infarct , age undetermined Abnormal ECG When compared with ECG of 28-Oct-2021 09:28, No significant change since last tracing Confirmed by Benjiman Core [id number]) on 04/23/2023 8:21:26 PM  Radiology No results found.  Procedures Procedures    Medications Ordered in ED Medications - No data to display  ED Course/ Medical Decision Making/ A&P                                 Medical Decision Making Amount and/or Complexity of Data Reviewed Labs: ordered.   Patient with dizziness.  Appears to be more vertiginous.  I think most likely peripheral.  Worse with certain positions.  Will give Antivert.  At times states she will feel some palpitations.  Has previously been worked up for this.  I reviewed cardiology note.  Blood work done reassuring.  Has  mild anemia that can be followed.  Sent in for abnormal EKG.  EKG is reassuring and I think the changes on the EKG at the urgent care were likely artifactual.  Doubt cardiac ischemia.  Appears stable for follow-up with ENT for the vertigo.        Final Clinical Impression(s) / ED Diagnoses Final diagnoses:  Vertigo    Rx / DC Orders ED Discharge Orders          Ordered    meclizine (ANTIVERT) 25 MG tablet  3 times daily PRN        04/23/23 2213              Benjiman Core, MD 04/23/23 2217

## 2023-04-23 NOTE — ED Triage Notes (Signed)
States dizziness off and on for about two weeks.  Mostly after work (night shift)  States dizziness worse with lying down.  Not having dizziness at present.  Sent from urgent care.

## 2023-09-04 ENCOUNTER — Ambulatory Visit: Payer: BC Managed Care – PPO | Admitting: Nurse Practitioner

## 2023-09-04 ENCOUNTER — Other Ambulatory Visit: Payer: BC Managed Care – PPO

## 2023-09-07 ENCOUNTER — Other Ambulatory Visit: Payer: BC Managed Care – PPO

## 2023-09-07 ENCOUNTER — Ambulatory Visit: Payer: BC Managed Care – PPO | Admitting: Nurse Practitioner

## 2023-09-07 ENCOUNTER — Other Ambulatory Visit: Payer: Self-pay

## 2023-10-11 IMAGING — MG MM DIGITAL SCREENING BILAT W/ TOMO AND CAD
8 series · 9 of 24 positions shown · non-contrast
Comparison: Previous exam(s).

CLINICAL DATA: Screening.

EXAM:
DIGITAL SCREENING BILATERAL MAMMOGRAM WITH TOMOSYNTHESIS AND CAD
TECHNIQUE: Bilateral screening digital craniocaudal and mediolateral oblique
mammograms were obtained. Bilateral screening digital breast
tomosynthesis was performed. The images were evaluated with
computer-aided detection.

[R MLO synth-2D]
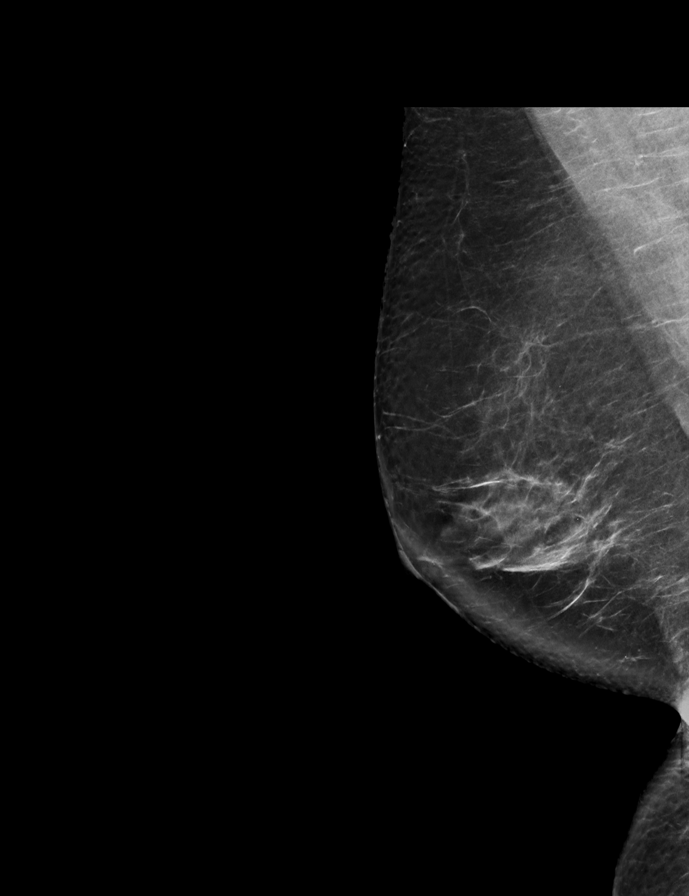

[L CC synth-2D]
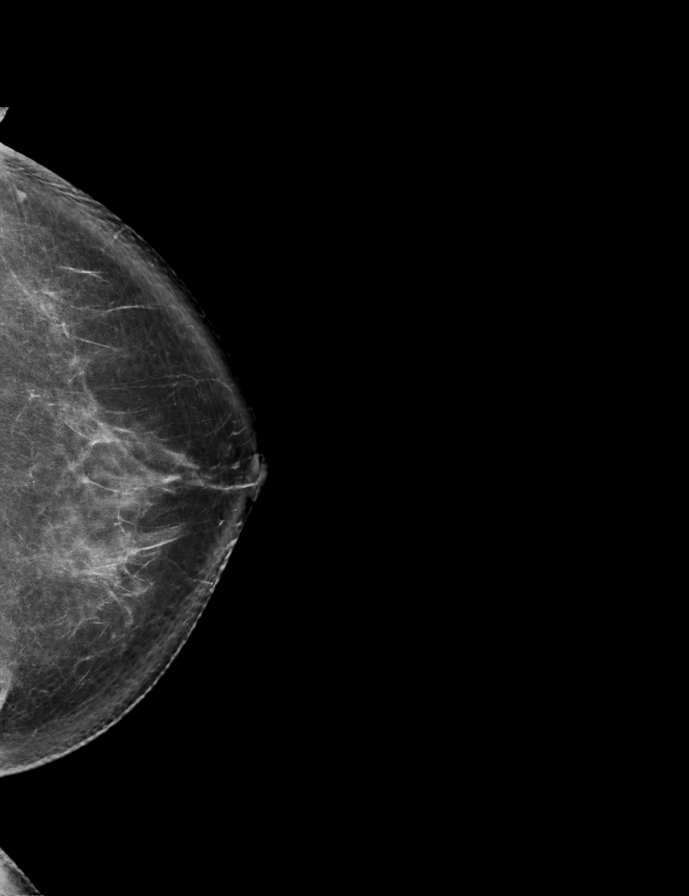

[R CC synth-2D]
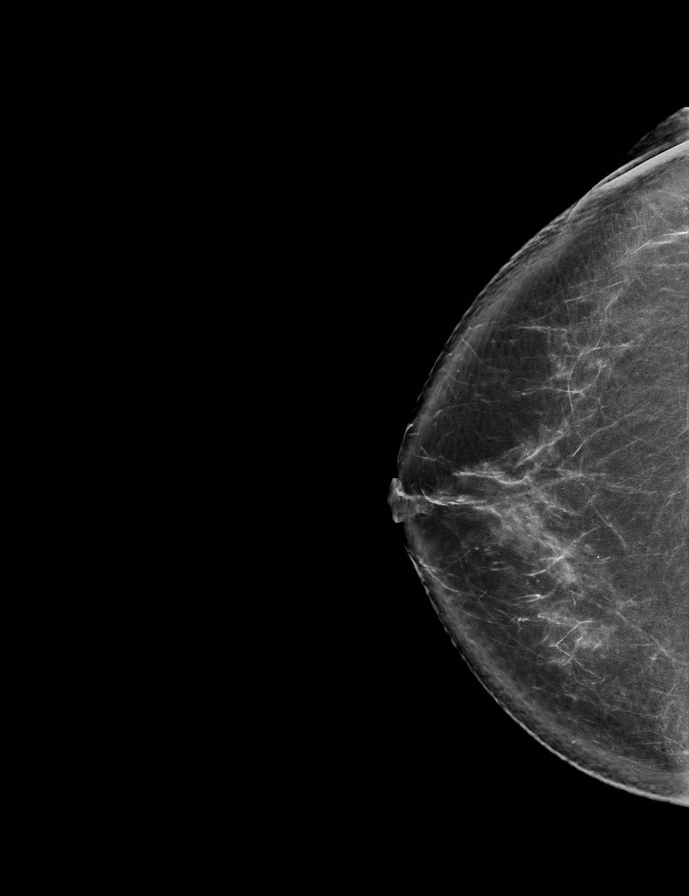

[L MLO synth-2D]
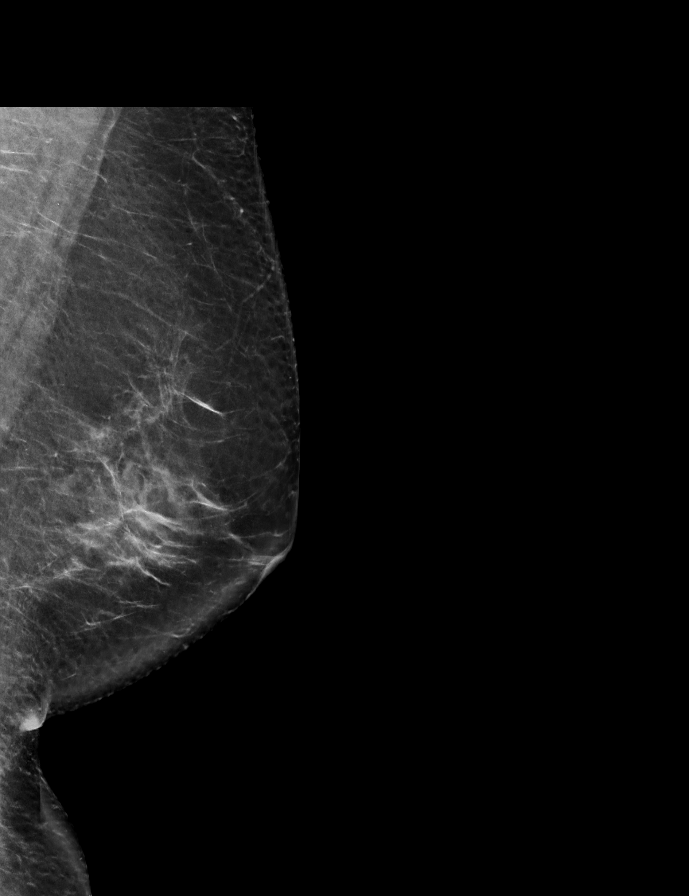

[L CC tomo · 2 of 83 frames shown]
[frame 27/83]
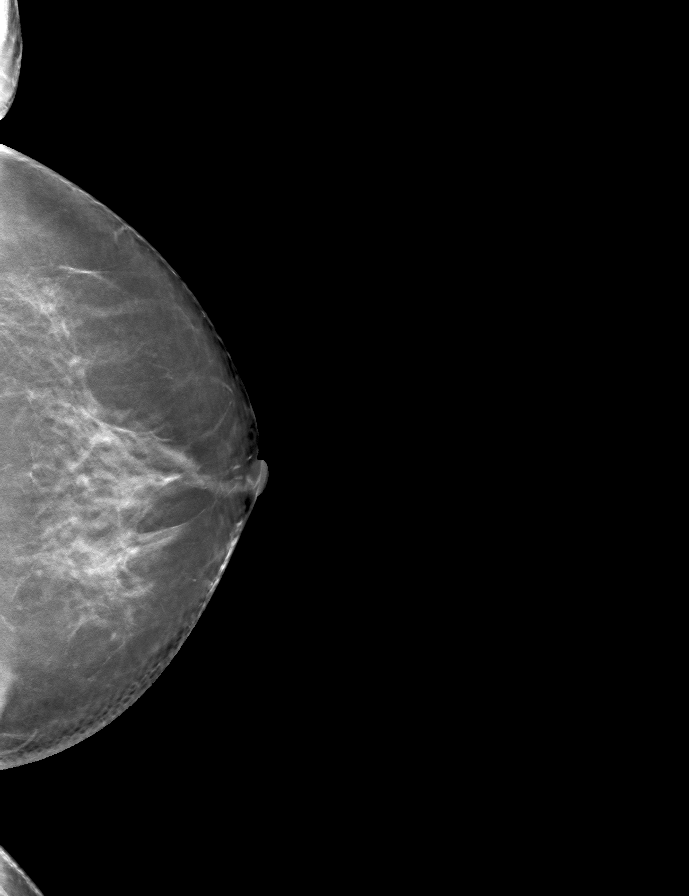
[frame 42/83]
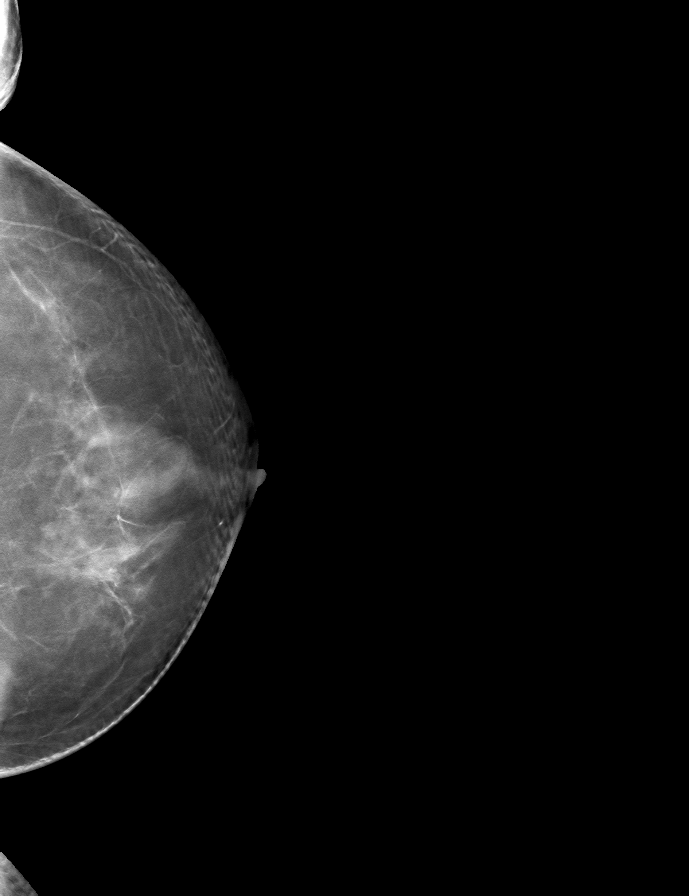

[L MLO tomo · tomo slice 40/79.0]
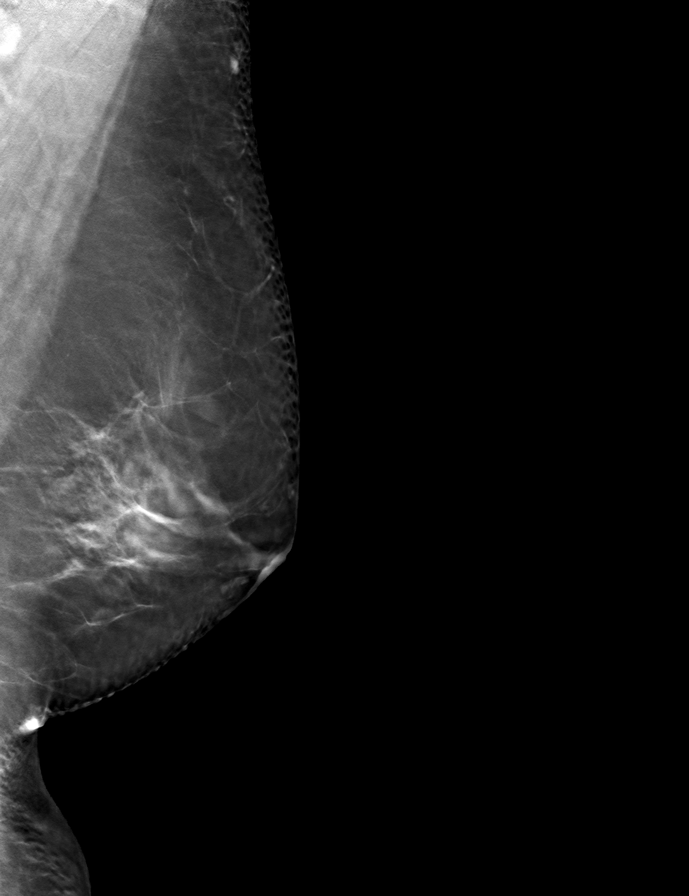

[R MLO tomo · tomo slice 41/81.0]
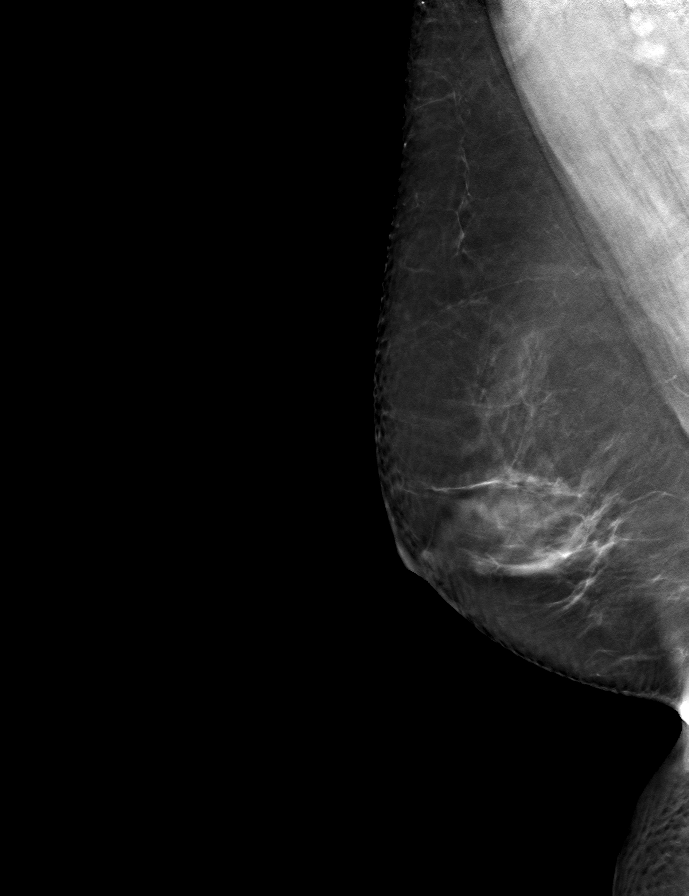

[R CC tomo · tomo slice 43/85.0]
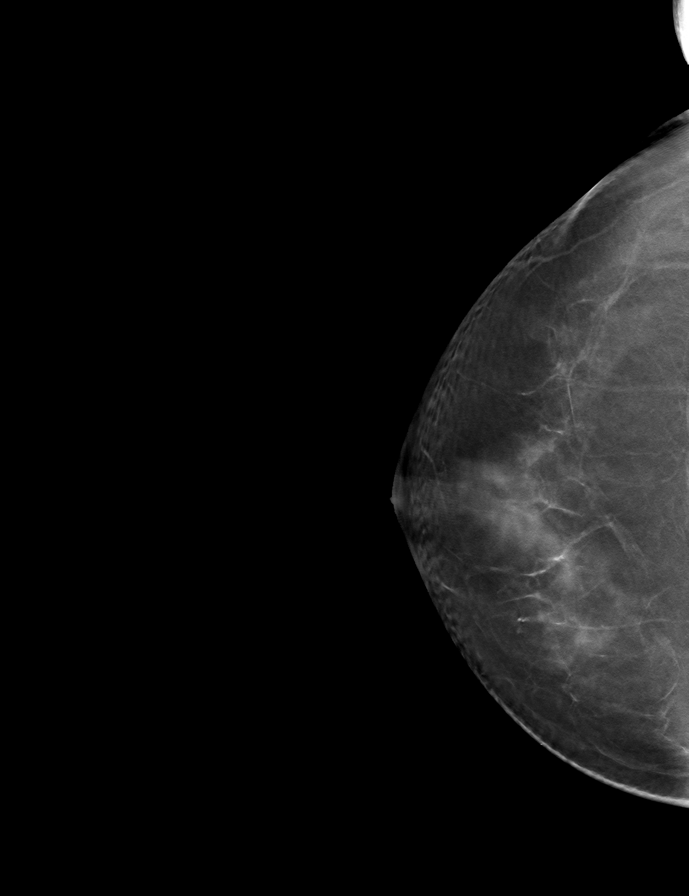

[9 of 24 positions shown; findings below may reference images not displayed]

ACR Breast Density Category c: The breast tissue is heterogeneously
dense, which may obscure small masses.
FINDINGS: There are no findings suspicious for malignancy.
IMPRESSION: No mammographic evidence of malignancy. A result letter of this
screening mammogram will be mailed directly to the patient.

RECOMMENDATION:
Screening mammogram in one year. (Code:Q3-W-BC3)

BI-RADS CATEGORY  1: Negative.

## 2023-12-27 ENCOUNTER — Other Ambulatory Visit: Payer: Self-pay | Admitting: Family Medicine

## 2023-12-27 DIAGNOSIS — Z1231 Encounter for screening mammogram for malignant neoplasm of breast: Secondary | ICD-10-CM

## 2024-01-23 ENCOUNTER — Other Ambulatory Visit: Payer: Self-pay

## 2024-01-23 DIAGNOSIS — D75839 Thrombocytosis, unspecified: Secondary | ICD-10-CM

## 2024-01-30 ENCOUNTER — Telehealth: Payer: Self-pay | Admitting: Nurse Practitioner

## 2024-01-30 NOTE — Telephone Encounter (Signed)
 PT needs to reschedule to September. Appt rescheduled, reason unspecified.

## 2024-02-01 ENCOUNTER — Other Ambulatory Visit: Payer: BC Managed Care – PPO

## 2024-02-01 ENCOUNTER — Ambulatory Visit

## 2024-02-01 ENCOUNTER — Inpatient Hospital Stay: Payer: Self-pay

## 2024-02-01 ENCOUNTER — Inpatient Hospital Stay: Payer: BC Managed Care – PPO | Admitting: Nurse Practitioner

## 2024-03-21 ENCOUNTER — Telehealth: Payer: Self-pay | Admitting: Oncology

## 2024-03-21 ENCOUNTER — Inpatient Hospital Stay: Attending: Nurse Practitioner

## 2024-03-21 ENCOUNTER — Encounter: Payer: Self-pay | Admitting: Nurse Practitioner

## 2024-03-21 ENCOUNTER — Inpatient Hospital Stay (HOSPITAL_BASED_OUTPATIENT_CLINIC_OR_DEPARTMENT_OTHER): Admitting: Nurse Practitioner

## 2024-03-21 ENCOUNTER — Ambulatory Visit
Admission: RE | Admit: 2024-03-21 | Discharge: 2024-03-21 | Disposition: A | Discharge status: Home / Self Care | Source: Ambulatory Visit | Attending: Family Medicine | Admitting: Family Medicine

## 2024-03-21 VITALS — BP 124/83 | HR 64 | Temp 97.7°F | Resp 16 | Ht 61.0 in | Wt 139.4 lb

## 2024-03-21 DIAGNOSIS — D75839 Thrombocytosis, unspecified: Secondary | ICD-10-CM | POA: Insufficient documentation

## 2024-03-21 DIAGNOSIS — I10 Essential (primary) hypertension: Secondary | ICD-10-CM | POA: Diagnosis not present

## 2024-03-21 DIAGNOSIS — D7581 Myelofibrosis: Secondary | ICD-10-CM | POA: Insufficient documentation

## 2024-03-21 DIAGNOSIS — Z1231 Encounter for screening mammogram for malignant neoplasm of breast: Secondary | ICD-10-CM

## 2024-03-21 LAB — CBC WITH DIFFERENTIAL (CANCER CENTER ONLY)
Abs Immature Granulocytes: 0.02 K/uL (ref 0.00–0.07)
Basophils Absolute: 0 K/uL (ref 0.0–0.1)
Basophils Relative: 0 %
Eosinophils Absolute: 0.2 K/uL (ref 0.0–0.5)
Eosinophils Relative: 3 %
HCT: 37.4 % (ref 36.0–46.0)
Hemoglobin: 12.4 g/dL (ref 12.0–15.0)
Immature Granulocytes: 0 %
Lymphocytes Relative: 37 %
Lymphs Abs: 2.8 K/uL (ref 0.7–4.0)
MCH: 29.1 pg (ref 26.0–34.0)
MCHC: 33.2 g/dL (ref 30.0–36.0)
MCV: 87.8 fL (ref 80.0–100.0)
Monocytes Absolute: 0.4 K/uL (ref 0.1–1.0)
Monocytes Relative: 5 %
Neutro Abs: 4.2 K/uL (ref 1.7–7.7)
Neutrophils Relative %: 55 %
Platelet Count: 425 K/uL — ABNORMAL HIGH (ref 150–400)
RBC: 4.26 MIL/uL (ref 3.87–5.11)
RDW: 12.7 % (ref 11.5–15.5)
WBC Count: 7.7 K/uL (ref 4.0–10.5)
nRBC: 0 % (ref 0.0–0.2)

## 2024-03-21 LAB — FERRITIN: Ferritin: 308 ng/mL — ABNORMAL HIGH (ref 11–307)

## 2024-03-21 NOTE — Telephone Encounter (Signed)
 Patient has been scheduled for follow-up visit per 03/21/24 LOS.  Pt noted appt details on personal planner/calendar.

## 2024-03-21 NOTE — Progress Notes (Signed)
  [facility] OFFICE PROGRESS NOTE   Diagnosis: Thrombocytosis  INTERVAL HISTORY:   Karina Baker was seen in an initial visit 01/19/2023 for evaluation of thrombocytosis.  Myeloproliferative panel was abnormal with a variant of potential clinical significance and a variant of unknown clinical significance.  We contacted her with the result.  She did not return for scheduled follow-up.  She feels well.  No bleeding.  No symptom of thrombosis.  No fevers or sweats.  Good appetite.  Weight is stable.  [place]  She had blood work there showing a platelet count of 378,000.  Objective:  Vital signs in last 24 hours:  Blood pressure 124/83, pulse 64, temperature 97.7 F (36.5 C), temperature source Temporal, resp. rate 16, height 5' 1 (1.549 m), weight 139 lb 6.4 oz (63.2 kg), SpO2 100%.    Resp: Lungs clear bilaterally. Cardio: Regular rate and rhythm. GI: No hepatosplenomegaly. Vascular: No leg edema.   Lab Results:  Lab Results  Component Value Date   WBC 7.7 03/21/2024   HGB 12.4 03/21/2024   HCT 37.4 03/21/2024   MCV 87.8 03/21/2024   PLT 425 (H) 03/21/2024   NEUTROABS 4.2 03/21/2024    Imaging:  No results found.  Medications: I have reviewed the patient's current medications.  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 Karina Baker appears stable.  She has persistent mild thrombocytosis.  We reviewed the myeloproliferative panel from 01/19/2023.  She understands there is a variant of potential clinical significance and a variant of unknown clinical significance.  Recommendation is for continued observation.  She agrees with this plan.  She will return for a CBC and follow-up visit in 6 months.  We are available to see her sooner if  needed.  Plan reviewed with Dr. Cloretta.    Aamani Moose ANP/GNP-BC   03/21/2024  9:56 AM

## 2024-04-13 ENCOUNTER — Emergency Department (HOSPITAL_BASED_OUTPATIENT_CLINIC_OR_DEPARTMENT_OTHER)
Admission: EM | Admit: 2024-04-13 | Discharge: 2024-04-13 | Disposition: A | Discharge status: Home / Self Care | Attending: Emergency Medicine | Admitting: Emergency Medicine

## 2024-04-13 ENCOUNTER — Emergency Department (HOSPITAL_BASED_OUTPATIENT_CLINIC_OR_DEPARTMENT_OTHER): Admitting: Radiology

## 2024-04-13 ENCOUNTER — Encounter (HOSPITAL_BASED_OUTPATIENT_CLINIC_OR_DEPARTMENT_OTHER): Payer: Self-pay

## 2024-04-13 DIAGNOSIS — R0789 Other chest pain: Secondary | ICD-10-CM | POA: Insufficient documentation

## 2024-04-13 DIAGNOSIS — Z7982 Long term (current) use of aspirin: Secondary | ICD-10-CM | POA: Diagnosis not present

## 2024-04-13 LAB — BASIC METABOLIC PANEL WITH GFR
Anion gap: 9 (ref 5–15)
BUN: 12 mg/dL (ref 6–20)
CO2: 26 mmol/L (ref 22–32)
Calcium: 9.6 mg/dL (ref 8.9–10.3)
Chloride: 106 mmol/L (ref 98–111)
Creatinine, Ser: 0.79 mg/dL (ref 0.44–1.00)
GFR, Estimated: >60 mL/min (ref >60)
Glucose, Bld: 96 mg/dL (ref 70–99)
Potassium: 3.7 mmol/L (ref 3.5–5.1)
Sodium: 142 mmol/L (ref 135–145)

## 2024-04-13 LAB — CBC
HCT: 38.1 % (ref 36.0–46.0)
Hemoglobin: 12.6 g/dL (ref 12.0–15.0)
MCH: 29.4 pg (ref 26.0–34.0)
MCHC: 33.1 g/dL (ref 30.0–36.0)
MCV: 88.8 fL (ref 80.0–100.0)
Platelets: 448 K/uL — ABNORMAL HIGH (ref 150–400)
RBC: 4.29 MIL/uL (ref 3.87–5.11)
RDW: 12.8 % (ref 11.5–15.5)
WBC: 7.1 K/uL (ref 4.0–10.5)
nRBC: 0 % (ref 0.0–0.2)

## 2024-04-13 LAB — D-DIMER, QUANTITATIVE: D-Dimer, Quant: 0.29 ug{FEU}/mL (ref 0.00–0.50)

## 2024-04-13 LAB — TROPONIN T, HIGH SENSITIVITY: Troponin T High Sensitivity: <15 ng/L (ref 0–19)

## 2024-04-13 NOTE — ED Provider Notes (Signed)
 [place] EMERGENCY DEPARTMENT AT [facility]  Provider Note  CSN: [id number] Arrival date & time: 04/13/24 9048  History Chief Complaint  Patient presents with   Chest Pain    Karina Baker is a 58 y.o. female with history of anxiety, PVCs and thrombocytosis reports she has had increased feeling of heart skipping beats and having a 'pinching' discomfort in her chest for the last few days. No SOB, leg swelling, cough, congestion, fever or recent long distance travel.    Home Medications Prior to Admission medications   Medication Sig Start Date End Date Taking? Authorizing Provider  Apple Cider Vinegar-Ginger (YUMVS APPLE CID VINEGAR-GINGER) 500-5 MG CHEW Chew by mouth.    [provider]  aspirin 81 MG EC tablet Take 1 tablet by mouth daily.    [provider]  Cholecalciferol (VITAMIN D3) 50 MCG (2000 UT) capsule Take 2,000 Units by mouth daily.    [provider]  Anselm Oil 1000 MG CAPS Take 1,000 capsules by mouth daily.    [provider]  meclizine  (ANTIVERT ) 25 MG tablet Take 1 tablet (25 mg total) by mouth 3 (three) times daily as needed for dizziness. Patient not taking: Reported on 03/21/2024 04/23/23   Patsey Lot, MD  Misc Natural Products (YUMVS BEET ROOT-TART CHERRY) 250-0.5 MG CHEW Chew by mouth.    [provider]  Multiple Vitamin (MULTIVITAMIN) tablet Take 1 tablet by mouth daily.    [provider]  olmesartan (BENICAR) 5 MG tablet Take 5 mg by mouth daily. 09/13/21   [provider]     Allergies    Patient has no known allergies.   Review of Systems   Review of Systems Please see HPI for pertinent positives and negatives  Physical Exam BP 112/70   Pulse (!) 57   Temp 98.6 F (37 C) (Oral)   Resp 15   SpO2 99%   Physical Exam Vitals and nursing note reviewed.  Constitutional:      Appearance: Normal appearance.  HENT:     Head: Normocephalic and  atraumatic.     Nose: Nose normal.     Mouth/Throat:     Mouth: Mucous membranes are moist.  Eyes:     Extraocular Movements: Extraocular movements intact.     Conjunctiva/sclera: Conjunctivae normal.  Cardiovascular:     Rate and Rhythm: Normal rate.  Pulmonary:     Effort: Pulmonary effort is normal.     Breath sounds: Normal breath sounds.  Abdominal:     General: Abdomen is flat.     Palpations: Abdomen is soft.     Tenderness: There is no abdominal tenderness.  Musculoskeletal:        General: No swelling. Normal range of motion.     Cervical back: Neck supple.  Skin:    General: Skin is warm and dry.  Neurological:     General: No focal deficit present.     Mental Status: She is alert.  Psychiatric:        Mood and Affect: Mood normal.     ED Results / Procedures / Treatments   EKG EKG Interpretation Date/Time:  Sunday April 13 2024 09:57:37 EDT Ventricular Rate:  75 PR Interval:  144 QRS Duration:  116 QT Interval:  384 QTC Calculation: 429 R Axis:   62  Text Interpretation: Sinus rhythm Nonspecific intraventricular conduction delay Borderline T abnormalities, anterior leads Baseline wander in lead(s) V2 No significant change since last tracing Confirmed by  Roselyn Dunnings [phone]) on 04/13/2024 9:59:02 AM  Procedures Procedures  Medications Ordered in the ED Medications - No data to display  Initial Impression and Plan  Patient here with atypical chest pains, overall well appearing and currently asymptomatic. Will check labs, including dimer given thrombocytosis, and CXR.   ED Course   Clinical Course as of 04/13/24 1203  Sun Apr 13, 2024  1029 CBC is normal.  [CS]  1031 I personally viewed the images from radiology studies and agree with radiologist interpretation: CXR is normal.  [CS]  1041 BMP and Trop are normal, given duration of symptoms and atypical nature, delta trop not necessary.  [CS]  1141 Dimer is neg.  [CS]  1201 Patient remains  asymptomatic, low suspicion for ACS or other acute life threatening process. Recommend outpatient PCP follow up if symptoms persist, RTED for any other concerns.  [CS]    Clinical Course User Index [CS] Roselyn Dunnings NOVAK, MD     MDM Rules/Calculators/A&P Medical Decision Making Given presenting complaint, I considered that admission might be necessary. After review of results from ED lab and/or imaging studies, admission to the hospital is not indicated at this time.    Problems Addressed: Atypical chest pain: acute illness or injury  Amount and/or Complexity of Data Reviewed Labs: ordered. Decision-making details documented in ED Course. Radiology: ordered and independent interpretation performed. Decision-making details documented in ED Course. ECG/medicine tests: ordered and independent interpretation performed. Decision-making details documented in ED Course.  Risk Decision regarding hospitalization.     Final Clinical Impression(s) / ED Diagnoses Final diagnoses:  Atypical chest pain    Rx / DC Orders ED Discharge Orders     None        Roselyn Dunnings NOVAK, MD 04/13/24 1203

## 2024-04-13 NOTE — ED Triage Notes (Addendum)
 Pt c/o intermittent CP and tightness with mild SOB since Friday, spells lasting for a few minutes and pt reports she can distract herself from it easily.  Pt denies nausea and dizziness.  Pt denies CP or tightness at present.

## 2024-04-13 NOTE — ED Notes (Signed)
 Patient given discharge instructions. Questions were answered. Patient verbalized understanding of discharge instructions and care at home.

## 2024-10-02 ENCOUNTER — Other Ambulatory Visit

## 2024-10-02 ENCOUNTER — Ambulatory Visit: Admitting: Oncology

## 2024-12-04 ENCOUNTER — Ambulatory Visit: Admitting: Oncology

## 2024-12-04 ENCOUNTER — Other Ambulatory Visit
